# Patient Record
Sex: Female | Born: 1935 | Race: White | Hispanic: No | Smoking: Never smoker
Health system: Southern US, Community
[De-identification: ages and names within clinical notes are randomized; demographics above are authoritative.]

## PROBLEM LIST (undated history)

## (undated) DIAGNOSIS — I219 Acute myocardial infarction, unspecified: Secondary | ICD-10-CM

## (undated) DIAGNOSIS — G709 Myoneural disorder, unspecified: Secondary | ICD-10-CM

## (undated) DIAGNOSIS — F419 Anxiety disorder, unspecified: Secondary | ICD-10-CM

## (undated) DIAGNOSIS — R05 Cough: Secondary | ICD-10-CM

## (undated) DIAGNOSIS — I2510 Atherosclerotic heart disease of native coronary artery without angina pectoris: Secondary | ICD-10-CM

## (undated) DIAGNOSIS — I82409 Acute embolism and thrombosis of unspecified deep veins of unspecified lower extremity: Secondary | ICD-10-CM

## (undated) DIAGNOSIS — R053 Chronic cough: Secondary | ICD-10-CM

## (undated) DIAGNOSIS — R109 Unspecified abdominal pain: Secondary | ICD-10-CM

## (undated) DIAGNOSIS — R1312 Dysphagia, oropharyngeal phase: Secondary | ICD-10-CM

## (undated) DIAGNOSIS — I10 Essential (primary) hypertension: Secondary | ICD-10-CM

## (undated) DIAGNOSIS — M349 Systemic sclerosis, unspecified: Secondary | ICD-10-CM

## (undated) HISTORY — DX: Atherosclerotic heart disease of native coronary artery without angina pectoris: I25.10

## (undated) HISTORY — PX: CHOLECYSTECTOMY: SHX55

## (undated) HISTORY — PX: ABDOMINAL HYSTERECTOMY: SHX81

## (undated) HISTORY — PX: CARDIAC SURGERY: SHX584

## (undated) HISTORY — PX: CORONARY ARTERY BYPASS GRAFT: SHX141

## (undated) HISTORY — PX: ANKLE SURGERY: SHX546

## (undated) HISTORY — DX: Acute myocardial infarction, unspecified: I21.9

## (undated) HISTORY — PX: OTHER SURGICAL HISTORY: SHX169

## (undated) HISTORY — DX: Acute embolism and thrombosis of unspecified deep veins of unspecified lower extremity: I82.409

---

## 2000-04-09 ENCOUNTER — Inpatient Hospital Stay (HOSPITAL_COMMUNITY): Admission: AD | Admit: 2000-04-09 | Discharge: 2000-04-21 | Payer: Self-pay | Admitting: Cardiology

## 2000-04-10 ENCOUNTER — Encounter: Payer: Self-pay | Admitting: Cardiothoracic Surgery

## 2000-04-11 ENCOUNTER — Encounter: Payer: Self-pay | Admitting: Cardiothoracic Surgery

## 2000-04-12 ENCOUNTER — Encounter: Payer: Self-pay | Admitting: Cardiothoracic Surgery

## 2000-04-13 ENCOUNTER — Encounter: Payer: Self-pay | Admitting: Cardiothoracic Surgery

## 2000-04-14 ENCOUNTER — Encounter: Payer: Self-pay | Admitting: Cardiothoracic Surgery

## 2000-04-16 ENCOUNTER — Encounter: Payer: Self-pay | Admitting: Cardiothoracic Surgery

## 2000-05-19 ENCOUNTER — Inpatient Hospital Stay (HOSPITAL_COMMUNITY): Admission: AD | Admit: 2000-05-19 | Discharge: 2000-05-24 | Payer: Self-pay | Admitting: Cardiothoracic Surgery

## 2000-05-19 ENCOUNTER — Encounter: Payer: Self-pay | Admitting: Cardiothoracic Surgery

## 2001-09-13 ENCOUNTER — Ambulatory Visit: Admission: RE | Admit: 2001-09-13 | Discharge: 2001-09-13 | Payer: Self-pay | Admitting: Thoracic Surgery

## 2001-10-15 ENCOUNTER — Encounter: Payer: Self-pay | Admitting: Thoracic Surgery

## 2001-10-17 ENCOUNTER — Inpatient Hospital Stay (HOSPITAL_COMMUNITY): Admission: RE | Admit: 2001-10-17 | Discharge: 2001-10-20 | Payer: Self-pay | Admitting: Thoracic Surgery

## 2001-10-17 ENCOUNTER — Encounter (INDEPENDENT_AMBULATORY_CARE_PROVIDER_SITE_OTHER): Payer: Self-pay | Admitting: *Deleted

## 2001-10-19 ENCOUNTER — Encounter: Payer: Self-pay | Admitting: Thoracic Surgery

## 2004-02-02 ENCOUNTER — Ambulatory Visit (HOSPITAL_COMMUNITY): Admission: RE | Admit: 2004-02-02 | Discharge: 2004-02-02 | Payer: Self-pay | Admitting: Internal Medicine

## 2005-04-07 ENCOUNTER — Ambulatory Visit: Payer: Self-pay | Admitting: Cardiology

## 2005-04-12 ENCOUNTER — Ambulatory Visit: Payer: Self-pay | Admitting: Cardiology

## 2005-04-27 ENCOUNTER — Ambulatory Visit: Payer: Self-pay | Admitting: Cardiology

## 2005-12-20 ENCOUNTER — Ambulatory Visit: Payer: Self-pay | Admitting: Internal Medicine

## 2006-08-22 ENCOUNTER — Ambulatory Visit: Payer: Self-pay | Admitting: Internal Medicine

## 2006-09-28 ENCOUNTER — Ambulatory Visit: Payer: Self-pay | Admitting: Internal Medicine

## 2010-09-05 ENCOUNTER — Emergency Department (HOSPITAL_COMMUNITY): Admission: EM | Admit: 2010-09-05 | Discharge: 2010-09-05 | Payer: Self-pay | Admitting: Emergency Medicine

## 2010-09-05 ENCOUNTER — Ambulatory Visit: Payer: Self-pay | Admitting: Internal Medicine

## 2011-02-09 LAB — WOUND CULTURE

## 2011-04-14 NOTE — Discharge Summary (Signed)
[place]. [facility]  Patient:    Colleen Perry, Colleen Perry                    MRN: [id number] Adm. Date:  [id number] Disc. Date: 05/24/00 Attending:  Mikey Bussing Dictator:   Loura Pardon, P.A.                           Discharge Summary  DATE OF BIRTH:  May 16, 1932  CARDIOLOGIST:  Lewayne Bunting, M.D.  PRIMARY CAREGIVER:  Wende Crease M.D.  FINAL DIAGNOSIS:  Staphylococcus aureus saphenous venectomy wound infection right lower extremity (IV antibiotic therapy this hospitalization).  SECONDARY DIAGNOSES: 1. History of unstable angina.  Atherosclerotic coronary artery disease,    status post coronary artery bypass graft surgery by Dr. Kathlee Nations Trigt on    Apr 11, 2000. 2. Status post cholecystectomy. 3. History of ankle fracture x 2. 4. Status post carpal tunnel release. 5. History of scleroderma with esophageal stricture, periodic esophageal    dilatations. 6. Hypertension. 7. Obesity. 8. Status post hysterectomy/removal of facial tumors.  PROCEDURES:  IV antibiotics this hospitalization consisting of IV vancomycin for a five-day period with debridement of the necrotic areas of the wound in the right lower extremity as needed during this hospitalization.  DISPOSITION:  Colleen Perry is judged a suitable candidate for discharge on hospital day #6 after being admitted on May 19, 2000 for IV antibiotic treatment of Staphylococcus aureus saphenous venectomy wound infection right lower extremity.  She has remained afebrile while in this hospital.  She is ambulating independently.  She has received daily whirlpool treatments with twice daily dressing changes consisting of wet-to-dry dressing changes at the wound site of the right lower extremity.  She has had debridement of the wound of necrotic tissue at the wound base as needed.  She has evidence of granulation tissue formation.  Her pain is well-controlled with oral analgesia.  She will go home with  home health to help with daily dressing changes.  Her IV vancomycin was accompanied by IV Cipro.  This was changed on hospital day #4 to oral Cipro and she will go home on a 10-day course of oral Cipro, which is renewable upon the discretion of Dr. Kathlee Nations Trigt.  She is taking oral nourishment well and tolerating it.  She has full GI tract function.  The other incisions that were occasioned during the cardiac surgery in May 2001 are healing well.  DISCHARGE MEDICATIONS:  1. Vicodin 5/500 1-2 tablets p.o. q.4-6h. p.r.n. pain.  2. Cipro 500 mg 1 p.o. b.i.d.  3. Flovent 44 mcg per inhalation 2 puffs b.i.d.  4. Combivent metered dose inhaler 2 puffs every six hours.  5. Lasix 40 mg q.d.  6. Potassium chloride 20 mEq daily.  7. Lopressor 50 mg 1/2 tablet in the morning and 1/2 tablet in the evening.  8. Xanax 0.25 mg every eight hours as needed for anxiety.  9. Multivitamin with iron daily. 10. Enteric-coated aspirin 325 mg q.d. 11. Protonix 40 mg daily.  ACTIVITY:  Ambulation as tolerated.  DIET:  Low sodium/low cholesterol diet.  WOUND CARE:  She may bathe daily.  She is to try to time her bathing for just before dressing changes.  The bathing does not consist of bathtub soaking but only shower.  Home health will provide dressing changes once daily, wet-to-dry, with sterile saline gauze packed gently, and the wound wrapped with  Curlex.  FOLLOW-UP:  She will see Dr. Donata Clay Friday, June 01, 2000, at 10:45 in the morning for a wound check.  BRIEF HISTORY:  Colleen Perry is a 77 year old female.  She underwent coronary artery bypass graft surgery Apr 11, 2000.  She has done well postoperatively, until approximately two weeks before this admission, when she was admitted to have some erythema in the lower section of the saphenous venectomy site of the right lower extremity.  The first incision at the level of the ankle was noted to have erythema.  This was subsequently  opened slightly and the culture was taken.  She was started at that time on Augmentin; however, the culture came back positive for Staphylococcus aureus. The patient was again seen by Dr. Kathlee Nations Trigt May 18, 2000.  At that time, the wound was further opened and debrided.  The patient had been having her wound packed by home health on a daily basis but with further examination and deterioration in the status of the wound, it was decided that hospitalization would be best, and she was subsequently admitted for IV antibiotic therapy.  This was begun May 19, 2000.  She has received IV vancomycin during her hospital stay.  She has also had supplementation with IV Cipro, which was changed on hospital day #4 to p.o. Cipro.  HOSPITAL COURSE:  She was admitted to [facility] with a diagnosis of Staphylococcus aureus saphenous venectomy wound infection right lower extremity.  She was immediately placed on IV vancomycin and IV Cipro. Dressing changes were begun twice daily with one of the dressing changes to consist of a pulsatile lavage delivered by physical therapy, then with a wet-to-dry dressing placed in the wound.  This regimen has produced significant clearing of necrosis and the beginning of healing in her right lower extremity saphenous venectomy incision.  Colleen Perry has been ambulating with the help of a walker and then independently.  She goes home on the medications and with home health to follow her.  As dictated above, she will see Dr. Donata Clay on Friday, June 01, 2000, at 10:45 in the morning. DD:  05/23/00 TD:  05/24/00 Job: [id number]

## 2011-04-14 NOTE — Op Note (Signed)
Colleen Perry, Colleen Perry                       ACCOUNT NO.:  [id number]   MEDICAL RECORD NO.:  [id number]                   PATIENT TYPE:  AMB   LOCATION:  DAY                                  FACILITY:  [facility]   PHYSICIAN:  Lionel December, M.D.                 DATE OF BIRTH:  1932-03-06   DATE OF PROCEDURE:  DATE OF DISCHARGE:  02/02/2004                                 OPERATIVE REPORT   PROCEDURE:  Esophageal manometry.   INDICATIONS:  [name] is a 77 year old Caucasian female with a history of  scleroderma who presents with recurrent dysphagia.  She had a barium study  which shows narrowing to the proximal segment of esophagus with ballooning  of hypopharynx on swallowing, as well as some narrowing at the GE junction.  She is undergoing this study prior to EGD/ED.  The procedure and risks were  reviewed with the patient and informed consent was obtained.   This is the second dictation, the first one apparently got messed up and had  multiple blanks.   METHOD/PROCEDURE:  The esophageal manometry study was performed using  [employer] Gastrosoft Upper Gastrointestinal Polygram, version 6.40,  using an Arndorfer infusion system at 0.5 cc/min. The lower esophageal  sphincter (LES) was identified in four ports with the standard station pull  through technique. For esophageal body pressures, the tip of the catheter  was placed 3 cm above the proximal aspect of the LES. Ten wet swallows were  taken with ports 5 cm apart in the esophageal body. This procedure was  reviewed with the patient prior to performing it.   FINDINGS:  Esophageal body:  1 out of 10 swallows peristaltic; 9 out of 10  were absent or low amplitude.   Mean amplitude at 13 cm 84.5, at 8 cm 49.1, at 3 cm 18.9, and mean and  distal 2 ports was 34 mmHg.   Mean duration:  3.3 at 13 cm, 4.9 at 8 cm, and 2.8 at 3 cm, mean and distal  2 ports were 3.8.   Lower esophageal sphincter (LES):  Located between 42 and  45 cm from the  nares.   Mean resting LES pressure 11.3.   Relaxation:  About 80%.  There were prolonged episodes of relaxation with  some swallows.   IMPRESSION:  Abnormal study with the following findings:  1. Low LES pressure with episodes of prolonged relaxation.  2. Low-to-absent esophageal peristalsis.  3. These findings can be seen with scleroderma.  4. There is no manometric evidence of achalasia.   PLAN:  We will proceed with EGD.      ___________________________________________                                            Lionel December, M.D.   NR/MEDQ  D:  02/22/2004  T:  02/22/2004  Job:  [id number]   cc:   Fayrene Fearing M.D. Aundra Millet  [place]

## 2011-04-14 NOTE — Procedures (Signed)
[place]. [facility]  Patient:    Colleen Perry, Colleen Perry                    MRN: [id number] Proc. Date: 04/09/00 Adm. Date:  [id number] Attending:  Talitha Givens CC:         Dr. Wende Crease                           Procedure Report  PROCEDURE PERFORMED:  Left heart catheterization with coronary angiography and left ventriculography.  I. INTRODUCTION:  The patient is a 77 year old woman with a history of known coronary artery disease, who presents to the hospital with unstable angina. She was subsequently transferred here for additional evaluation and treatment. Her symptoms had been improved with intravenous heparin and nitroglycerin.  II. PROCEDURE:  After informed consent was obtained, the patient was taken to the diagnostic catheterization laboratory in the fasting state.  After usual preparation and draping, intravenous fentanyl was given for intravenous sedation.  A total of 20 cc of lidocaine was infiltrated into the right femoral region.  The right femoral artery was subsequently punctured and the left Judkins catheter was advanced into the left main coronary artery. Coronary angiography of the left main system was then carried out.  After removal of the left Judkins catheter, the right Judkins catheter was inserted and advanced into the right coronary artery and selective coronary angiography of the right coronary system was then carried out.  Following this, the right Judkins catheter was removed and the pigtail catheter was inserted retrograde across the aortic valve and into the left ventricle.  Left ventriculography in the RAO projection was then carried out.  Following this, the catheter was removed.  Hemostasis was assured and the patient was returned to her room in good condition.  III. COMPLICATIONS:  None.  IV. RESULTS:  A. HEMODYNAMICS:  The left ventricular pressure was 159/12 and the aortic pressure was 160/80.  B. LEFT  VENTRICULOGRAPHY:  Left ventriculography was performed in the RAO projection with a total of 30 cc of contrast injected over 2.5 seconds.  This demonstrated preserved left ventricular systolic function.  Interpretation of the actual left ventricular ejection fraction was not possible secondary to multiple PVCs.  There did not appear to be any segmental wall motion abnormalities.  C. CORONARY ANGIOGRAPHY:  The left main coronary artery and its proximal left circumflex and left anterior descending arteries were calcified fairly moderately.  The distal left main had a 50% stenosis.  It gave rise to a left anterior descending artery, which had a 90% ostial stenosis and was diffusely disease.  The LAD gave rise to two diagonal branches, both of which had severe disease.  The left circumflex also came off of the left main system and it had a large obtuse marginal branch with luminal irregularities.  The proximal left circumflex had a 75-80% stenosis.  The distal left circumflex was occluded and its terminal marginal branch appeared to be fed with collaterals from the right system.  The right coronary artery was a dominant vessel and had a 65-70% eccentric stenosis of the proximal portion.  In addition, there was modest diffuse disease of the distal RCA and PDA systems.  There appeared to be right to left collaterals feeding the distal LAD and the terminal obtuse marginal branch of the left circumflex system.  V. CONCLUSION:  This study demonstrates preserved left ventricular systolic function with severe coronary  disease, including a distal 50% left main stenosis, 90% ostial LAD stenosis with diffuse disease, an 80% proximal left circumflex stenosis with the distal left circumflex being occluded after a large obtuse marginal branch and finally, a 70% proximal RCA stenosis.  VI. RECOMMENDATIONS:  Will be to consult cardiovascular surgeons. DD:  04/09/00 TD:  04/10/00 Job: [id number]

## 2011-04-14 NOTE — H&P (Signed)
[place]. [facility]  Patient:    Colleen Perry, Colleen Perry Visit Number: [id number] MRN: [id number]          Service Type: Attending:  D. Karle Plumber, M.D. Dictated by:   Durenda Age, P.A.-C. Adm. Date:  09/17/01   CC:         Dr. Latina Craver, M.D.                         History and Physical  DATE OF BIRTH:  October 27, 1932  CHIEF COMPLAINT:  Substernal thyroid goiter.  HISTORY OF PRESENT ILLNESS:  Colleen Perry is a pleasant 77 year old white female referred by Dr. Doyne Keel for evaluation of substernal thyroid goiter.  The patient was experiencing dysphagia, which prompted her to be evaluated by Dr. Karilyn Cota.  Among several tests, a barium swallow was performed, revealing some neck compression in the area.  A followup CT revealed a superior mediastinal mass consistent with a thyroid goiter with marked enlargement of the left thyroid lobe in substernal position, causing deviation of the trachea and compression of the trachea to the right side, the mass measuring about 3.7 x 4.3 cm.  This mass is also compressing the esophagus and responsible for this dysphagia.  PFTs showed an FVC of 1.89 and an FEV1 of 1.32.  Dr. Edwyna Shell recommends to proceed with left thyroidectomy scheduled for September 17, 2001. Other than the symptoms mentioned above, the patient is experiencing cough with no sputum production, chills, shortness of breath and dyspnea on exertion, as well as GERD symptoms.  PAST MEDICAL HISTORY:  Hypertension, hypercholesterolemia, anxiety, GERD--esophageal stricture with periodic dilatations, history of morphea (form of scleroderma), chronic pain syndrome, history of bronchial asthma, CAD status post CABG, status post MI in May 2001, left thyroid goiter, prolapsed rectum secondary to scleroderma, decreased hearing.  PAST SURGICAL HISTORY:  Status post cholecystectomy in 1998, status post CABG x 4 Dr. Donata Clay May 2001, status post  revision and debridement of the legs secondary to cellulitis post CABG in June 2001, status post gastrostomy, status post hysterectomy, status post left carpal tunnel syndrome, status post left foot fracture repair, status post removal of a facial skin tumor.  MEDICATIONS:  1. Metoprolol 25 mg b.i.d.  2. Lasix 40 mg q.d.  3. Hydrocodone 5/500 two p.o. q.4-6h. p.r.n. for pain.  4. Zocor 20 mg q.d.  5. K-Dur unknown dose, although the patient says that it is possible 20 mEq     q.d.  6. Coated aspirin q.d.  7. Oxazepam 10 mg 1 p.o. q.h.s. p.r.n.  8. Protonix 40 mg q.d.  9. Alprazolam 0.5 mg q.i.d. p.r.n. 10. Dulcolax 2 p.o. q.h.s. 11. Albuterol inhaler 2 puffs p.r.n. q.4h. 12. Flovent 2 puffs b.i.d.  ALLERGIES:  No known drug allergies.  REVIEW OF SYSTEMS:  See HPI and past medical history for significant positives.  She also experiences hoarseness and nausea at times.  No angina or arrhythmias.  No symptoms of TIA, CVA, or amaurosis fugax.  No recent history of pneumonia.  No PE or DVT.  She has mentioned the difficulty for wound healing, as well as increased sensitivity to pain.  FAMILY HISTORY:  Mother died of CVA at 61.  Father died of MI at 32.  Two brothers died of cancer, one of brain cancer, and one of liver cancer.  SOCIAL HISTORY:  Widowed, three children.  One of  them is a Engineer, civil (consulting).  She is a housewife.  She denies any tobacco or alcohol intake.  PHYSICAL EXAMINATION:  GENERAL:  An obese 77 year old white female in no acute distress.  Alert and oriented x 3, somewhat anxious.  VITAL SIGNS:  Blood pressure 180/80, pulse 64, respirations 18.  HEENT:  Normocephalic, atraumatic.  PERRL.  EOMI.  Funduscopic exam within normal limits.  NECK:  Supple.  No JVD, bruits, or lymphadenopathy.  The thyroid cannot be palpated.  CHEST:  Symmetrical on inspiration.  Lungs clear to auscultation.  CARDIOVASCULAR:  Regular rate and rhythm with a well-healed sternotomy site. There  is an area of the sternotomy which is still tender.  No murmurs, rubs, or gallops.  ABDOMEN:  Obese, soft, nontender.  Bowel sounds x 4.  No masses or bruits.  GENITOURINARY/RECTAL:  Deferred.  EXTREMITIES:  No clubbing, positive for onychomycosis.  No cyanosis.  There is chronic left ankle edema.  This ankle is also much larger than the right, but this is chronic.  No ulcerations.  Somewhat decreased temperature in her hands bilaterally.  Peripheral pulses:  Carotids through distal pulses 2+ bilaterally.  There are rheumatoid arthritis changes in both hands with ulnar deviation, as well as scleroderma changes in the right index finger.  Of note, the patient has fairly good skin turgor, despite her history of scleroderma. NEUROLOGIC:  Other than head bobbing with mild resting tremor, no other findings are seen.  DTRs 3+ on the left, 2+ on the right.  Muscle strength 4/5 bilaterally.  ASSESSMENT AND PLAN:  The patient has a substernal thyroid goiter and will undergo substernal thyroidectomy at [facility] on September 17, 2001 by Dr. Edwyna Shell.  Dr. Edwyna Shell has seen and evaluated this patient prior to the admission and has explained the risks and benefits involved in the procedure and the patient has agreed to continue. Dictated by:   Durenda Age, P.A.-C. Attending:  D. Karle Plumber, M.D. DD:  09/13/01 TD:  09/13/01 Job: [id number]

## 2011-04-14 NOTE — Consult Note (Signed)
[place]. [facility]  Patient:    Colleen Perry, Colleen Perry                    MRN: [id number] Proc. Date: 04/10/00 Adm. Date:  [id number] Attending:  Talitha Givens Dictator:   [id number] CC:         CVTS office             Rudene Christians. Ladona Ridgel, M.D. [facility]                          Consultation Report  REASON FOR CARDIOTHORACIC SURGICAL CONSULTATION:  Severe three-vessel coronary disease with unstable angina and mildly positive cardiac enzymes.  HISTORY OF PRESENT ILLNESS:  The patient is an obese 77 year old white female with a history of coronary disease status post cardiac catheterization three years ago, who presented to her local physician with 48 hours of progressive substernal chest pain with radiation to the left arm and associated shortness of breath.  She has had increasing exercise intolerance over the past two to three weeks with dyspnea on exertion.  She has also had some difficulty with orthopnea.  She presented to her local hospital and was admitted for acute unstable angina and cardiac enzymes were mildly positive at 5 units.  She was placed on anticoagulation, beta-blocker and nitroglycerin and the cardiology consultation was obtained.  The patient was subsequently transferred to [facility] and on 04/10/00, underwent cardiac catheterization by Dr. Ladona Ridgel, which demonstrated severe three vessel coronary disease with 90% stenosis of the LAD, 80% stenosis of the proximal circumflex and 80% stenosis of the right coronary artery with overall ejection fraction of 55%  Her LVEDP was 12 mmHg.  She developed some post-cath chest pain and was stabilized with IV heparin and V nitroglycerin and is currently comfortably in her hospital room following cardiac catheterization on nitroglycerin and IV heparin.  The patient was referred for coronary revascularization due to her bad coronary  anatomy and symptoms of unstable angina.  PAST MEDICAL  HISTORY: 1. Reactive airway disease, on inhaler therapy. 2. History of scleroderma, with associated esophageal stricture for which she    received periodic dilatation by her local physician. 3. Hypertension.  SURGICAL HISTORY:  Positive for a laparoscopic cholecystectomy in 1998, orthopedic surgery on a fractured left ankle, hysterectomy and removal of facial skin tumors.  MEDICATIONS:  At home - nitroglycerin p.r.n., Cardizem XL 240 mg q.d., albuterol inhaler two puffs q.i.d., Flovent inhaler two puffs b.i.d., Lasix 40 mg p.o. q.d., aspirin one p.o. q.d.  ALLERGIES:  She denies allergies to any known medications.   SOCIAL HISTORY:  The patient lives alone; her husband died of a stroke three years ago.  Daughter lives close by, in [place].  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  She denies alcohol intake and denies smoking at any time.  REVIEW OF SYSTEMS:  The patients current weight of 200 pounds is stable.  She denies any recent fever or respiratory infection.  She denies any recent change in her bowel or bladder habits.  She does have chronic constipation and has a rectal prolapse and requires Dulcolax on a daily basis.  She has had some mild symptoms of dysphagia and was scheduled to see her local physician for an esophageal dilatation within the next 30-60 days.  Cardiac history is positive for angina and some symptoms of dyspnea on exertion and orthopnea.  She  denies any history of arrhythmia or cardiac murmur.  Pulmonary history is positive for reactive airway disease but no recent productive cough or upper respiratory infection.  GI history is positive for a cholecystectomy and she denies any blood per rectum, jaundice or abdominal pain.  GU history is negative for polyuria or dysuria.  Vascular history is negative for DVT or claudication.  Neurologic history is negative for TIA, seizure, syncope or concussion.  Hematologic history is  negative for bleeding diathesis or easy bruisability.  Skin has developed some small tumors, which have been excised.  Psychologic history is negative for depression, insomnia or diminished appetite.  PHYSICAL EXAMINATION:  GENERAL:  She is a pleasant, anxious female who appears older than her stated age. Height 5 ft 4 in.  Weight 200 lb.  VITAL SIGNS:  Blood pressure 150/80, pulse 70 per minute in sinus rhythm and she is comfortable, at rest.  HEENT:  Exam normocephalic, with full EOMS.  Pharynx clear.  Dentition is under  good repair.  NECK:  Supple, without JVD, thyromegaly, mass or carotid bruit.  LUNGS:  Clear, without wheeze and without chest wall deformity.  CARDIAC:  Regular rate and rhythm without S3 gallop or cardiac murmur.  ABDOMEN:  Soft, with well-healed incisions from laparoscopic cholecystectomy and bowel sounds are present.  She has a slight ecchymotic area on the right groin secondary to the cardiac cath.  EXTREMITIES:  Her left ankle has changes of orthopedic surgery and the left lower leg is mildly edematous, without pitting edema.  She has 1+ palpable pedal pulses bilaterally.  She has a palpable radial artery pulses bilaterally.  She is right hand dominant.  SKIN:  Without evidence of skin cancer or rash.  NEUROLOGIC:  Alert and oriented x 3 with full motor function.  LABORATORY DATA:  Chest x-ray is pending.  Vascular lab studies indicate mild plaque and atherosclerotic disease of the carotid arteries bilaterally.  Has left palmar arch insufficiency; the right palmar arch is normal.  Peripheral pulses are intact in the lower extremities.  Preoperative electrolytes: normal BUN and creatinine and blood glucose.  IMPRESSION/PLAN:  The patient has severe three vessel coronary disease with symptoms of unstable angina and will be scheduled for coronary bypass surgery in the morning.  I discussed the major aspects of the operation with the  patient and daughter, including the location of the surgical incisions, the choice of conduit,  the use  of cardiopulmonary bypass and general anesthesia, and the expected hospital recovery.  We discussed the associated risks of myocardial infarction, cerebro-  vascular accident, bleeding, infection and death.  We discussed the alternatives to surgery for her coronary artery disease.  She understands these aspects and agrees to proceed with the operation as planned, under informed consent. DD:  04/10/00 TD:  04/10/00 Job: [id number]

## 2011-04-14 NOTE — Op Note (Signed)
NAMEBAHJA, BENCE                       ACCOUNT NO.:  [id number]   MEDICAL RECORD NO.:  [id number]                   PATIENT TYPE:  AMB   LOCATION:  DAY                                  FACILITY:  [facility]   PHYSICIAN:  Lionel December, M.D.                 DATE OF BIRTH:  08/04/1932   DATE OF PROCEDURE:  02/02/2004  DATE OF DISCHARGE:                                 OPERATIVE REPORT   PROCEDURE:  Esophagogastroduodenoscopy with esophageal dilatation.   ENDOSCOPIST:  Lionel December, M.D.   INDICATIONS:  Colleen Perry is a 77 year old Caucasian female who presents with  progressive dysphagia to solids, liquids, as well as pills.  She has had her  esophagus dilated previously.  In the past she is felt to have esophageal  motility disorder possibly on the basis of her scleroderma. She had a barium  study on January 29, 2004 which showed partial obstruction or narrowing at the  cervical esophagus with ballooning of hypopharynx.  She aspirated some  barium.  She had somewhat dilated body of the esophagus with narrowing at GE  junction.  Barium pill passed across the cervical esophagus without any  difficulty, but there was a hang up at the gastroesophageal junction.  She  is, therefore undergoing reevaluation with EGD.  She had esophageal  manometry this morning which shows complete relaxation of LES but she has  simultaneous swallows.  Therefore, EM is consistent with __________ study  reveals either poor peristalsis or simultaneous contractions.  Manometrically she does not meet the criterion for achalasia   She also has a history of submucosal bulbar lesion since May 2000.   She is undergoing diagnostic and therapeutic procedure.  The procedure and  risks were reviewed with the patient and informed consent was obtained.   PREOPERATIVE MEDICATIONS:  Cetacaine spray for oropharyngeal topical  anesthesia, Demerol 50 mg IV and Versed 15 mg IV.   FINDINGS:  Procedure performed in endoscopy  suite.  The patient's vital  signs and O2 saturation were monitored during the procedure and remained  stable.  The patient was placed in the left lateral recumbent position and  Olympus videoscope was passed via the oropharynx without any difficulty into  the esophagus.  Her hypopharyngeal mucosa and laryngeal area appeared to be  normal. Pictures taken for the record.   There was some resistance noted with passage of the scope across the  proximal esophagus, but examination of the mucosa on the way out revealed no  abnormality.  Some resistance was noted as the scope was passed across the  ____________ into the cervical esophagus.  There was a tiny patch of ectopic  gastric mucosa.  The mucosa of the rest of the esophagus was normal. The  squamocolumnar junction was wavy, but there was no ring or stricture.  There  was a small sliding hiatal hernia no more than 3 cm in length.   STOMACH:  It  was empty and distended very well with insufflation.  The folds  of the proximal stomach were normal.  Examination of the mucosa was normal.  The pyloric channel was patent.  The angularis, fundus, and cardia were  examined by retroflexing the scope and were normal.   DUODENUM:  On entering the bulb there was at least a 15 cm mass involving  the posterolateral wall.  Endoscopically it did not appear to be a bigger  than on previous study of January 2002; however, it has central depression  with erosion.  There was no bleeding.  I opted not to take any biopsy from  it.  The mucosa and folds of the second part of the duodenum were normal.   The endoscope was withdrawn.   The esophagus was dilated by passing 54 and 56 [place] Maloney dilator to  full insertion.  Some resistance was noted in the early stage felt to be due  to a cervical esophagus.  The endoscope was passed again, and esophagus  reexamined and there was a very tiny linear tear in the area of the cervical  esophagus.  There was no  mucosal disruption at the GE junction.   The endoscope was withdrawn.  The patient tolerated the procedure well.   FINAL DIAGNOSES:  1. Small sliding hiatal hernia.  No endoscopic evidence of stricture at GE     junction or proximal esophagus.  Some resistance noted on passage of the     scope felt to be due to her cervical esophagus.  Suspect a muscular     process or spasm of the segment.  2. Esophagus dilated by passing 54 and 56 [place] Maloney dilator.  3. Submucosal bulbar lesion which is unchanged; however, has central     depression with erosion.   RECOMMENDATIONS:  1. She will continue her usual medications.  She should also continue with     her PPI.  2. The patient advised to watch her stools and if they turn black she needs     to let Dr. Doyne Keel or myself know.  3. I feel that she will need myotomy of this proximal esophageal segment via     neck approach. Will discuss findings and recommendations with the her     daughter, Ms. [name], when she is here.      ___________________________________________                                            Lionel December, M.D.   NR/MEDQ  D:  02/02/2004  T:  02/02/2004  Job:  [id number]   cc:   Wende Crease, M.D.

## 2011-04-14 NOTE — Discharge Summary (Signed)
[place]. [facility]  Patient:    Colleen Perry, Colleen Perry                    MRN: [id number] Adm. Date:  [id number] Disc. Date: 04/21/00 Attending:  Mikey Bussing Dictator:   Sherrie George, P.A. CC:         Kathlee Nations Suann Larry, M.D.             Lewayne Bunting, M.D. - [facility]             Norval Morton, M.D. - [place], [facility]                           Discharge Summary  DATE OF BIRTH:  03/03/1932.  ADMITTING DIAGNOSES: 1. Unstable angina. 2. History of scleroderma with associated esophageal stricture and periodic    esophageal dilatation. 3. Hypertension.  DISCHARGE DIAGNOSES: 1. Three vessel coronary disease with unstable angina. 2. History of scleroderma with a history of esophageal strictures. 3. Hypertension. 4. Dysphagia and postoperative nausea. 5. Perioperative hyperglycemia. 6. Bilateral atelectasis. 7. Obesity.  PROCEDURES: 1. Cardiac catheterization Apr 09, 2000. 2. Coronary artery bypass grafting x 4, left internal mammary to the left anterior descending, saphenous vein graft to the circumflex, saphenous vein graft posterior descending and posterior lateral Apr 11, 2000.  BRIEF HISTORY:  The patient is a 77 year old white female, a medical patient of Dr. Lewayne Bunting who was transferred from [facility] with chest pain.  She had a 48 hour history of progressive substernal pain with radiation to the left arm and associated shortness of breath.  She had increasing exercise intolerance over the past 2-3 weeks with dyspnea on exertion and some difficulty with orthopnea.  She presented to her local hospital and was admitted for acute unstable angina.  Cardiac enzymes were mildly elevated. She was placed on anticoagulation, beta blockers, and nitroglycerin and a cardiology consult was obtained.  She was seen and subsequently transferred to [facility] on Apr 09, 2000, for cardiac catheterization by  Dr. Ladona Ridgel.  PAST MEDICAL HISTORY:  includes reactive airway disease on inhaler therapy.  A history of scleroderma with associated esophageal strictures for which she has received periodic dilatations and hypertension.  PAST SURGICAL HISTORY:  Include laparoscopic cholecystectomy, orthopedic surgery on a fracture left ankle, hysterectomy and removal of facial tumors.  MEDICATIoNS AT HOME: 1. Cardizem XL 240 mg q.d. 2. Nitroglycerin p.r.n. 3. Albuterol inhaler 2 puffs q.i.d. 4. Flovent 2 puffs b.i.d. 5. Lasix 40 mg q.d. 6. Aspirin 1 q.d.  For further history and physical please see the dictated note.  HOSPITAL COURSE:  The patient was admitted and underwent cardiac catheterization which showed a 50% distal left main, 90% ostial LAD with diffuse LAD disease and an 80% proximal left circumflex with total occlusion of the distal circumflex, a 70% proximal right coronary artery eccentric lesion was also present.  Ejection fraction was estimated at 55%.  The patient tolerated the procedure well and after completion consult with Dr. Kathlee Nations Trigt was obtained for revascularization of her coronary artery disease.  Dr. Donata Clay evaluated the patient and he agreed she had severe 3 vessel disease with symptoms of unstable angina and scheduled her for coronary artery bypass grafting.  The risks and benefits were discussed in detail and informed consent was obtained.  The patient remained hemodynamically stable and had no perioperative problems.  She  underwent routine preoperative studies and on Apr 11, 2000, underwent coronary artery bypass grafting x 4, with left internal mammary to the left anterior descending, saphenous vein graft to the circumflex, saphenous vein graft sequentially to the posterior descending and posterolateral coronary arteries.  The patient tolerated the procedure well and returned to the intensive care unit in satisfactory condition.  She had poor quality vein  and diffuse coronary artery disease.  The patient also had a hematocrit of 20 while on cardiopulmonary bypass and was transfused with 1 unit of packed cells.  She was then transferred to the ICU in satisfactory condition.  She remained hemodynamically and neurologically intact.  She was extubated on the postoperative day.  On the first postoperative morning she was in sinus rhythm, hemodynamically stable on low dose dopamine. O2 saturations were 99% on 2 liters.  Chest x-ray showed some left lower lobe atelectasis.  CBGs were within normal limits and overall she was doing well. Initially they weaned her of dopamine, discontinued her lines, and will plan to keep her in the [facility] secondary to her debilitated state.  She continued to do well with saturations up to 93% on room air by the first postoperative evening.  The second postoperative day chest x-ray showed bilateral atelectasis and mild edema but overall she was intact and hemodynamically stable.  They continued diuresis and she was kept in the ICU until Apr 13, 2000 when a bed was available on 2000.  She was transferred.  She had difficulty swallowing and was placed on a dysphagia 2 diet.  She was seen in consultation by the speech therapy and she was maintained on a dysphagia 2 diet and slowly advanced to a dysphagia 3 diet.  She has had postoperative nausea along with some wheezing and rales and has been treated with inhalers. She has been diuresed vigorously, has been slow to ambulate, she has required a walker and a fair amount of assistance. Despite this she has continued to show improvement.  She is now weaned off her oxygen with saturations greater than 90 on room air.  Her weight is still elevated.  She has plus 1 to plus 2 edema especially in her right lower extremity.  Her nausea has been attributed to Percocet and she has been switched to Darvocet for pain.  She has had moderate success with this.  As she has continued to  improve it was Dr. Vincent Gros opinion on Apr 16, 2000, that she would be ready for discharge home in  the a.m. Apr 21, 2000, with a home health nurse.  He wanted to make sure the patient was on Lasix 40 mg a day for 30 days, and Darvocet for pain.  Additional medications will include: 1. Protonix 40 mg 1 q.d. for 30 days. 2. Lasix 40 mg p.o. q.d. for 30 days. 3. POtassium chloride 20 mEq q.d. 4. Flovent 44 mcg 2 puffs b.i.d. 5. Combivent MDI 2 puffs q.i.d. 6. Coated aspirin 325 mg 1 q.d. 7. A multivitamin with iron 1 q.d. 8. Lopressor 25 mg q.12h. 9. Darvocet as noted above.  DISCHARGE ACTIVITY:  Light to moderate.  No lifting over 10 pounds.  No driving, no strenuous activity.  FOLLOWUP:  The patient will return to see Dr. Donata Clay on May 11, 2000, at 9:45 a.m. with a chest x-ray from Dr. Felisa Bonier office.  She will return to see Dr. Andee Lineman on May 04, 2000, at 10 a.m.  LABORATORY DATA:  Sodium 138, potassium 4.3, chloride 100,  CO2 33, glucose is 97, BUN 14, creatinine 0.8, calcium 8.4, hemoglobin is 9.0, hematocrit 26.6, platelets are 360,000, white count is 7.8.  CONDITION ON DISCHARGE:  Improving. DD:  04/20/00 TD:  04/20/00 Job: [id number]

## 2011-04-14 NOTE — Op Note (Signed)
[place]. [facility]  Patient:    Colleen Perry, Colleen Perry                    MRN: [id number] Proc. Date: 04/11/00 Adm. Date:  [id number] Attending:  Mikey Bussing CC:         CVTS Office             [facility]                           Operative Report  PROCEDURE:  Aortocoronary bypass grafting x 4 (left internal mammary artery to AD, saphenuos vein graft to obtuse marginal, sequential saphenuos vein graft to posterior descending and posterolateral branch of the right coronary).  PREOPERATIVE DIAGNOSIS:  Class IV unstable angina with severe three vessel coronary artery disease.  POSTOPERATIVE DIAGNOSIS:  Class IV unstable angina with severe three vessel coronary artery disease.  SURGEON:  Mikey Bussing, M.D.  ASSISTANT:  Eugenia Pancoast, P.A.  ANESTHESIA:  General by Janetta Hora. Gelene Mink, M.D.  INDICATIONS:  The patient is a 77 year old obese white female with new onset chest pain and admission for rule out MI.  Cardiac catheterization demonstrated severe three vessel coronary artery disease including 90% stenosis of the LAD, 90% stenosis of the OM, and 80 to 90% stenosis of the right coronary artery with distal disease as well.  She was referred for coronary revascularization.  Prior to the operation, the patient was examined in her hospital room following cardiac catheterization and the results of the cardiac catheterization were discussed with the patient and family.  The indications and expectant benefits of coronary artery bypass grafting were discussed.  I reviewed the major aspects of the procedure including the placement of the surgical incisions, the use of cardiopulmonary bypass with general anesthesia, the expected possible recovery, and the choice f conduit.  I discussed the risks associated with this operation including the risks of MI, CVA, bleeding, infection, blood transfusion requirement, and  death.  She  understood these implications for surgery and agreed to proceed with the operation as planned under informed consent.  FINDINGS:  Due to the patients body habitus, exposure of the posterior lateral aspect of the left ventricle was very difficult.  The circumflex was intramyocardial and would not be a vessel for redo grafting.  The saphenous vein was of poor quality and the entire greater saphenous vein was harvested in order to find adequate vein to use.  The left leg has been severely damaged from multiple orthopedic procedures.  The mammary artery was small, but with excellent flow, nd the coronaries were diffusely diseased and suboptimal targets for grafting.  DESCRIPTION OF PROCEDURE:  The patient was brought to the operating room and placed supine on the operating table where general anesthesia was induced under invasive hemodynamic monitoring.  The chest, abdomen, and legs were prepped with Betadine and draped as a sterile field.  A median sternotomy was performed and the saphenous vein was harvested from the right lower extremity.  The internal mammary artery was harvested as a pedicle graft from its origin at the subclavian vessels. Heparin was administered and ACT was documented as being therapeutic.  The patient was hen placed on bypass and cooled to 32 degrees.  The coronaries were identified and he mammary artery and vein grafts were prepared for the distal anastomosis.  A cardioplegia cannula was placed and the aortic crossclamp was applied, 500  cc of cold blood cardioplegia was delivered to the aortic root with immediate cardioplegic arrest and septal temperature dropping to less than 12 degrees. Topical iced saline slush was used to augment myocardial preservation and a pericardial insulator pad was used to protect the left phrenic nerve.  The distal coronary anastomoses were then performed.  The first distal anastomosis was the sequential vein  graft to the posterior descending continuing to the posterolateral.  The posterior descending was a 1.5 mm vessel with proximal 90%  stenosis and a side-to-side anastomosis with the vein was performed with a running 7-0 Prolene with good flow through the graft.  The second distal anastomosis was the continuation of the sequential vein to the posterolateral branch of the right. This was a larger 1.8 mm vessel with proximal 90% stenosis.  The end of the vein was sewn end-to-side with a running 7-0 Prolene and there was good flow through  this sequential vein graft.  Cardioplegia was redosed.  The third distal anastomosis was to the obtuse marginal.  This was intramyocardial on the lateral wall of the left ventricle.  It was 1.5 mm in diameter and a saphenous vein was  sewn end-to-side with a running 7-0 Prolene with good flow through the graft. Cardioplegia was redosed.  The fourth distal anastomosis was to the distal third of the LAD which was a 1.5 mm vessel.  It had a proximal 90% stenosis.  The left internal mammary artery pedicle was brought through an opening created in the left lateral pericardium, was brought down, and sewn end-to-side with a running 8-0 Prolene.  There was excellent flow through the anastomosis with immediate rise n septal temperature after release of the pedicle clamp on the mammary artery. The mammary pedicle was secured to the epicardium and the aortic crossclamp was removed.  The heart resumed a spontaneous rhythm.  A partial occluding clamp was placed on the ascending aorta and two proximal vein anastomoses were performed using a 4.0 mm punch and running 6-0 Prolene.  The partial clamp was removed and the vein grafts were perfused.  The patient was rewarmed to 37 degrees and temporary pacing wires were applied.  The lungs were reexpanded and the ventilator was turned on. When the patient was warmed, she was weaned from cardiopulmonary bypass  without inotropes with a stable blood pressure and excellent cardiac output.  Protamine was administered and the cannuli were removed.  The mediastinum was irrigated with arm  antibiotic irrigation and the leg incision was irrigated and closed in a standard fashion.  The pericardium was loosely reapproximated and two mediastinal and left pleural chest tubes were placed.  Hemostasis was adequate.  The sternum was reapproximated with eight interrupted steel wires.  The pectoralis fascia and subcutaneous layers were closed with a running Vicryl.  The skin was closed with a subcuticular and sterile dressings were applied.  Total cardiopulmonary bypass ime was 140 minutes with aortic crossclamp time of 80 minutes. DD:  04/11/00 TD:  04/16/00 Job: [id number]

## 2011-04-14 NOTE — Op Note (Signed)
NAMEALFRETTA, PINCH                       ACCOUNT NO.:  [id number]   MEDICAL RECORD NO.:  [id number]                   PATIENT TYPE:  AMB   LOCATION:  DAY                                  FACILITY:  [facility]   PHYSICIAN:  Lionel December, M.D.                 DATE OF BIRTH:  June 14, 1932   DATE OF PROCEDURE:  DATE OF DISCHARGE:  02/02/2004                                 OPERATIVE REPORT   PROCEDURE:  Esophageal manometry.   INDICATIONS:  Ms. Beman is a 77 year old Caucasian female with a history of  scleroderma who presented to the physician's office as well as __________  proximal esophagus.  She has ballooning of her hypopharynx on swallowing and  she had some aspiration.  She also has ___________ level of GE junction.  Barium pill passed through proximal esophagus ____________ but did get hung  up at the level of the GE junction.  She is also felt to have __________   DICTATION BECOMES INAUDIBLE AT THIS POINT.      ___________________________________________                                            Lionel December, M.D.   NR/MEDQ  D:  02/11/2004  T:  02/12/2004  Job:  [id number]

## 2011-04-14 NOTE — Op Note (Signed)
[place]. [facility]  Patient:    Colleen Perry, Colleen Perry Visit Number: [id number] MRN: [id number]          Service Type: SUR Location: [facility] 28[address] [id number] Attending Physician:  Cameron Proud Dictated by:   D. Karle Plumber, M.D. Admit Date:  10/17/2001   CC:         Dr. Doyne Keel in [facility]   Operative Report  PREOPERATIVE DIAGNOSIS:  Substernal multinodular goiter with a substernal thyroid goiter.  POSTOPERATIVE DIAGNOSIS:  Substernal multinodular goiter with a substernal thyroid goiter.  OPERATION PERFORMED:  Thyroidectomy in the left lobe of the thyroid, resection of multinodular goiter.  SURGEON:  D. Karle Plumber, M.D.  FIRST ASSISTANT:  Adair Patter, P.A.  ANESTHESIA:  General.  INDICATIONS:  This patient had had a previous coronary artery bypass and was found to have a substernal goiter that was on the left side with tracheal deviation.  This had apparently increased in size since her coronary artery bypass.  DESCRIPTION:  She was brought to the operating room, underwent general anesthesia.  The neck and chest were prepped and draped in the usual sterile manner.  An 8-10 cm incision was made in the skin fold in the neck approximately 2 cm above the sternal notch and dissection was carried down to the subcutaneous tissue.  An anterior superior and inferior flaps were elevated, elevating superiorly up to the hyoid bone and inferiorly down to the clavicles.  The strap muscles were identified and opened in the midline, and dissection was carried down to the trachea.  The sternohyoid and the sternothyroid muscles were reflected laterally.  The sternal hyoid was partially divided.  The right lobe of the thyroid was essentially normal - maybe slightly enlarged but there was not goiterness to require resection. The isthmus was dissected up and looped with the vascular tape, and then dissection was started on the left lobe of the thyroid.  Dissection  was carried superiorly, dissecting up the superior thyroid artery and veins and doubly ligating them with 2-0 silk and dividing them, then dissecting all attachments attempting to stay on the capsule of the thyroid, then looking for parathyroids.  One was thought to be identified on the left side superiorly around the superior thyroid artery.  Then dissection was carried down, getting the middle thyroid artery and vein and then double ligating them with 2-0 silk and dividing them.  Then dissection was carried inferiorly and underneath the sternum and the goiter was dissected up and elevated into the neck after dissecting it free inferiorly, and then it was elevated.  Multiple small vessels including the inferior thyroid artery and vein were doubly ligated and divided.  The isthmus was then divided with 2-0 silk.  As the left lobe of the thyroid was dissected up, the recurrent nerve was identified and preserved and all attachments to the trachea were clipped and divided until the left lobe was removed.  The area was irrigated copiously.  A Jackson-Pratt drain was placed in the thyroid bed, and then the strap muscles were closed with interrupted 2-0 Vicryl and subcutaneous tissues with 3-0 Vicryl, and a subcuticular stitch with 4-0 Monocryl.  The patient was returned to the recovery room in stable condition. Dictated by:   D. Karle Plumber, M.D.  Attending Physician:  Cameron Proud DD:  10/17/01 TD:  10/17/01 Job: [id number]

## 2011-04-14 NOTE — Discharge Summary (Signed)
[place]. [facility]  Patient:    Colleen Perry, Colleen Perry Visit Number: [id number] MRN: [id number]          Service Type: SUR Location: [id number] Attending Physician:  Cameron Proud Dictated by:   Gwenyth Bender, C.R.N.A. Admit Date:  10/17/2001 Discharge Date: 10/20/2001   CC:         Dr. Darrall Dears, M.D.   Discharge Summary  DATE OF BIRTH:  [birth year]  ADMISSION DIAGNOSIS:  Substernal thyroid goiter.  PAST MEDICAL HISTORY:  1. Hypertension.  2. Hypercholesterolemia.  3. Anxiety.  4. GERD and esophageal stricture with periodic dilations.  5. History of morphea (form of scleral derma).  6. Chronic pain syndrome.  7. Bronchial asthma.  8. Coronary artery disease, status post myocardial infarction and coronary     artery bypass grafting in May of 2001.  9. Prolapsed rectum. 10. Decreased hearing. 11. History of recent guaiac positive stools, evaluated with an endoscopy     that reveals stomach polyps.  This has since resolved.  PAST SURGICAL HISTORY:  Status post gastrostomy, hysterectomy, and repair of left foot fracture.  ALLERGIES:  No known drug allergies.  DISCHARGE DIAGNOSES: 1. Substernal multinodular goiter, status post left thyroidectomy.  BRIEF HISTORY:  Colleen Perry is a 77 year old Caucasian female who was referred to Dr. Edwyna Shell by Dr. Doyne Keel for evaluation of substernal thyroid goiter. The patient was experiencing some dysphagia.  She saw Dr. Doyne Keel who recommended barium swallow.  This swallow revealed some neck compression.  A follow-up CT scan revealed a superior mediastinal mass measuring 3.7 x 4.3 cm.  This mass is consistent with a thyroid goiter and was causing deviation and compression of the trachea, also compression of the esophagus.  She was evaluated by Dr. Edwyna Shell at his office and he recommended surgical excision of this mass.  HOSPITAL COURSE:  On November 21, Colleen Perry was electively  admitted to [facility] under the care of D. Karle Plumber, M.D.  She underwent an uncomplicated left thyroidectomy, resection of multinodular goiter. She tolerated the procedure well and was transferred in stable condition to the PACU.  Postoperatively, Colleen Perry has remained hemodynamidally stable.  Initially, she had some difficulty swallowing, but this has resolved.  She is progressing well and recovering from her surgery and she is ready for discharge home today, October 20, 2001.  CONDITION ON DISCHARGE:  Improved.  DISCHARGE INSTRUCTIONS:  Include medications, activity, diet, wound care, and follow-up appointments.  Please see the discharge instruction sheet for details.  DISCHARGE MEDICATIONS:  1. Vicodin one to two p.o. q.4-6h. p.r.n. for pain.  2. Metoprolol 12.5 mg p.o. b.i.d.  3. Furosemide 40 mg p.o. q.d.  4. Potassium 20 mEq p.o. q.d.  5. Zocor 20 mg p.o. q.h.s.  6. Aspirin 325 mg p.o. q.d.  7. Protonix 40 mg p.o. q.d.  8. Oxazepam 10 mg p.o. q.h.s.  9. Alprazolam 0.25 mg p.o. p.r.n. for anxiety. 10. Albuterol inhaler p.r.n. 11. Dulcolax suppository p.r.n.  FOLLOW-UP:  The [facility] will be calling Colleen Perry with an appointment to see Dr. Edwyna Shell in approximately one week. Dictated by:   Gwenyth Bender, C.R.N.A. Attending Physician:  Cameron Proud DD:  10/20/01 TD:  10/21/01 Job: [id number]

## 2011-04-14 NOTE — H&P (Signed)
[place]. [facility]  Patient:    Colleen Perry, Colleen Perry                    MRN: [id number] Adm. Date:  [id number] Disc. Date: [id number] Attending:  Mikey Bussing Dictator:   Eugenia Pancoast, P.A. CC:         Mikey Bussing, M.D.             Lewayne Bunting, M.D.                         History and Physical  CHIEF COMPLAINT:   Infection in right lower leg.  HISTORY OF PRESENT ILLNESS:  This is a 77 year old female status post coronary artery bypass surgery on Apr 11, 2000.  She had done well postoperatively until approximately two weeks ago when she was noted to have some erythema of the lower section of the venectomy harvest site of the right leg.  The harvest site was done in skipped lesion fashion.  The first lesion at the ankle was noted to have erythema and this was subsequently opened up slightly and culture was done.  The patient was started on Augmentin at that time.  The culture came back positive for Staphylococcus aureus.  The patient was again seen by Mikey Bussing, M.D., on May 18, 2000.  At that time, the wound was opened further.  The patient had been having her wound packed by home health on a q.d. basis, but on further examination in the hospital it was noted not to be improving and subsequently it was decided that she should undergo IV antibiotic therapy.  She is subsequently admitted at this time to undergo IV antibiotic therapy and undergo dressing changes on a b.i.d. basis.  PAST MEDICAL HISTORY:  Significant for coronary artery bypass surgery on Apr 11, 2000.  She also had a cholecystectomy in the past.  She has had a fractured left ankle x2.  She had carpal tunnel release in the past.  PRESENT MEDICATIONS:  1. Enteric-coated aspirin 325 mg q.d.  2. Protonix 40 mg q.d.  3. Flovent 40 mg two puffs b.i.d.  4. Combivent inhaler two puffs q.i.d.  5. Multivitamin with iron q.d.  6. Lortab 5 mg q.4h. p.r.n. pain.  7. Colace  100 mg b.i.d.  8. Lopressor 50 mg 1/2 tablet q.12h.  9. Lasix 40 mg q.d. 10. K-Dur 20 mEq q.d. 11. Xanax 0.25 mg t.i.d. p.r.n.  ALLERGIES:  No known drug allergies.  SOCIAL HISTORY:  The patient is a widow.  She has a grown daughter.  She is retired.  She follows a low-fat, low-sodium diet.  She denies use of alcohol or tobacco.  FAMILY HISTORY:  Positive for coronary artery disease, cancer and strokes.  No history of kidney disease, diabetes or TB.  REVIEW OF SYSTEMS:  The patient denies any recent history of weight gain, weight loss, weakness, fever, chills, sweats, rashes, dizziness, cough, chest pain, COPD, PUD, PND, anemia, syncope, melena, hemoptysis, or hematemesis.  PHYSICAL EXAMINATION:  GENERAL:  Reveals a well-developed, well-nourished 77 year old female in no acute distress.  Oriented x 3.  Judgment and insight are appropriate.  VITAL SIGNS:  The patient is afebrile.  Vital signs are stable.  HEENT:  Ramer, AT, EOMI, PERRL, TMs clear.  Oropharynx clear.  Tonsils benign. Dentition adequate.  NECK:  Supple without JVD, lymphadenopathy, or thyromegaly.  No carotid bruits are noted.  Trachea in the midline.  CHEST:  Symmetrical inspiration without wheezes, rhonchi, or rales. ___________  equal bilaterally.  Percussion resonant.  There is a mediastinotomy scar which is healing satisfactorily.  CARDIOVASCULAR:  Regular rate and rhythm without murmur, rub, or gallop. PMI is not displaced.  No lifts, heaves, thrills, or rubs.  ABDOMEN:  Soft, bowel sounds heard in all four quadrants, nontender, no palpable pulsatile masses, no HSM, and no bruits are noted.  GENITOURINARY:  Deferred.  RECTAL:  Deferred.  EXTREMITIES:  Without clubbing, cyanosis, or edema.  The right lower leg at the ankle noted incision approximately 5 cm in length with surrounding erythema.  The wound is open with packing in place.  The rest of the incisions up the leg to the thigh are healing well  without any sign of any type of infection noted.  Peripheral pulses are intact.  NEUROLOGICAL:  Cranial nerves II-XII grossly intact without focal deficits. Muscle strength is equal bilaterally.  Gait is stable.  DTRs 2+ bilaterally. Spine is straight.  Sensory is intact without numbness or paresthesias.  IMPRESSION: 1. Infection, right lower extremity. 2. Coronary artery disease status post coronary artery bypass surgery. 3. Hypertension. 4. Anxiety. 5. Chronic obstructive pulmonary disease.  PLAN:  Admit for IV antibiotic therapy and wound care per Dr. Kathlee Nations Trigt. D:  05/18/00 TD:  05/18/00 Job: [id number]

## 2011-04-14 NOTE — Consult Note (Signed)
[name], [place]             ACCOUNT NO.:  [id number]   MEDICAL RECORD NO.:  [id number]           PATIENT TYPE:  AMB   LOCATION:                                FACILITY:  [facility]   PHYSICIAN:  Lionel December, M.D.    DATE OF BIRTH:  12/10/1931   DATE OF CONSULTATION:  08/22/2006  DATE OF DISCHARGE:                                   CONSULTATION   PRESENTING COMPLAINT:  Problems swallowing and lower abdominal pain.   HISTORY OF PRESENT ILLNESS:  Colleen Perry is a 77 year old Caucasian female who  is referred through courtesy of Dr. Doyne Keel for GI evaluation.  The patient  is well known to me from previous evaluation.  She was seen for dysphagia in  January this year.  Barium pill study was actually scheduled on 01/04/2006  at [facility] but the patient canceled or did not show up.   She continues to complain of dysphagia.  She has difficulty with solids as  well as liquids.  She has difficulty virtually every time with solids,  particularly meats and difficulty with liquids is intermittent.  She has  coughing spells when she eats and at times she regurgitates her food and  liquids via nose.  She also complains of inability to clear her throat of  saliva.  She wakes up intermittently and unable to do so.  She has no  appetite.  She has been losing weight which is felt to be involuntary.  She  has lost 20 pounds since her last visit in January.  She also states that  she cannot take a deep breath.  She can complains of insomnia.  She would  like to get her temazepam filled until she is able to see Dr. Eliberto Ivory, who is  the patient's primary care physician since Dr. Doyne Keel is working as a  hospitalist.  The patient also complains of abdominal pain which she has had  for 5 months.  Her daughter states that she has had this much longer.  Pain  is across the lower abdomen more on the left side than on the right,  described as sharp and may last for several minutes.  No association with  meals or  bowel movements but rest seemed to help as does her Vicodin.  She  has chronic constipation.  Her bowels move [place] as long as she stays on her  Dulcolax tablets.  She has history of rectal prolapse but lately has not had  problems.  She has noted scant amount of dark blood on few occasions.  She  lives alone and she does cooking on her own.  Her daughter helps her every  and [name], who is with her today also visits her frequently.   [name] has a long history of GERD.  She has undergone multiple EGDs in the  past.  She had one in October 1987.  Her esophagus was normal.  She had a  small polyp removed from her duodenum which was nonspecific.  She had  another EGD in May 2000 with esophageal dilation.  She had small sliding  hiatal  hernia and mild changes of reflux esophagitis limited to GE junction  and esophagus was empirically dilated.  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  She had another EGD  in January 2002 with esophageal dilation.  She had small sliding hiatal  hernia and stable submucosal lesion in the bulb felt to be leiomyoma.  She  had barium study in August 2002 for persistent dysphagia.  She had posterior  impression on the lower cervical esophagus consistent with cricopharyngeus  muscle but no ulcerations were noted.  13 mm barium pill traversed the  esophagus without any difficulty and there was question of extrinsic mass  effect in upper thoracic esophagus.  This was evaluated with chest CT  revealing superior mediastinal mass which turned out to be substernal  thyroid goiter which was further evaluated with chest CT showing a superior  mediastinal mass.  This was eventually removed at the time of CABG as  discussed below.   She another EGD in March 2005 revealing small sliding hiatal hernia but no  obvious stricture.  Duodenal submucosal mass was stable.   She had esophageal manometry in March 2005 which revealed low  LES pressure  with prolonged relaxation and low to absent esophageal peristalsis felt to  be consistent with a known diagnosis of scleroderma.   Finally she had another barium study in December 2006 by Dr. Doyne Keel which  suggested the narrowing seen in the lower cervical esophagus, pooling of  contrast in the piriform sinus and penetration to posterior to the  epiglottis.  Is unclear whether or not she has been seen by speech  pathologist.   She is presently on alprazolam 1 mg q.i.d., Zocor 20 mg q.d., Protonix 40 mg  q.a.m., MVI q.d., K-Dur 20 milliequivalents q.d., albuterol inhaler 2 puffs  q.i.d. p.r.n., torsemide 20-60 mg q.d., Celexa 40 mg q.d., Mucinex 16 mg  q.d., temazepam 30 mg q.h.s. p.r.n., Vicodin 10/660 one q. six p.r.n.,  Robaxin 500 mg t.i.d., __________ p.r.n., NitroQuick 0.4 mg sublingual  p.r.n.   PAST MEDICAL HISTORY:  Several year history of scleroderma, chronic GERD,  bronchial asthma, hypertension, coronary artery disease.  She is status post  four-vessel coronary artery bypass graft in May 2001 at which time she had  benign mediastinal lesion removed.  She has hyperlipidemia, chronic  constipation and rectal prolapse.  Other surgeries include cholecystectomy,  hysterectomy, surgery on her left foot for fracture, decompression of left  carpal tunnel.   Her GI history is reviewed above.  She also has insomnia, anxiety, neurosis  and depression.   SOCIAL HISTORY:  She is widowed.  She has two daughters and a son, one of  her daughters and [place].  She is retired.  She has never smoked cigarettes  or drank alcohol.   FAMILY HISTORY:  Noncontributory.  Her father had CAD, one brother died of  liver cancer, another one of brain cancer.   PHYSICAL EXAM:  Pleasant well-developed, well-nourished Caucasian female who  is quite anxious but in no distress.  She weighs 161.5 pound.  She is 5 feet 4 inches tall.  Pulse 52 per minute, blood pressure 110/78, temperature  is  98.4.  HEENT:  Conjunctivae is pink.  Sclera is nonicteric.  Oral pharyngeal mucosa  is normal.  Teeth in fair condition, some are missing.  Pharyngeal reflex is  intact.  NECK:  No neck masses or thyromegaly noted.  CARDIAC:  Exam with regular rhythm.  Normal S1 S3.  She has faint systolic  section murmur best heard at LLSB.  LUNGS:  Clear to auscultation.  ABDOMEN:  Is full, bowel sounds are normal.  No bruits noted, on palpation  is soft.  She has tenderness in both iliac fossae, slightly greater on left  than on the right.  No hepatosplenomegaly noted.  RECTAL:  Examination reveals large skin tag anteriorly, no prolapse is  evident.  Digital exam is normal and stool is guaiac negative.  She has trace edema around her ankles.   ASSESSMENT:  Jalayne is a 77 year old Caucasian female with several year  history of scleroderma, chronic GERD, who previously has been documented to  have impaired esophageal peristalsis and low LES pressure, who presents with  progressive dysphagia.  Her dysphagia appears to be both pharyngeal as well  as esophageal.  Before she is evaluated for esophageal dysphagia, she needs  to be reevaluated for oropharyngeal dysphagia.  While she has had a modified  barium study in past, it is unclear to me how long ago she had she was seen  by speech pathologist but I feel this needs to be redone.  Not mentioned  above, her heartburn is well-controlled with PPI.   Recurrent lower abdominal pain.  Her abdominal exam os relatively benign and  stool is guaiac negative.  This is possibly IBS; however, this could be IBS  but other conditions need to be ruled out.   RECOMMENDATIONS:  She will continue Protonix as before.  We will arrange for  a consultation with speech pathologist as she would need modified barium  swallow etc.  She will also undergo abdominal pelvic CT with oral IV  contrast.  We will request a copy of her recent lab studies from Dr.  Annitta Jersey  office.  She may need a BUN and creatinine prior to CT.   She was given prescription for Protonix 40 mg q.a.m. 30 with 11 refills.  She was also given prescription for temazepam 30 mg q.h.s. p.r.n. 30 without  refill, but this should hold her until she can make an appointment to see  Dr. Eliberto Ivory.   We appreciate the opportunity to participate in the care of this nice lady.      Lionel December, M.D.  Electronically Signed     NR/MEDQ  D:  08/22/2006  T:  08/23/2006  Job:  [id number]

## 2011-04-14 NOTE — H&P (Signed)
[place]. [facility]  Patient:    Colleen Perry, Colleen Perry Visit Number: [id number] MRN: [id number]          Service Type: Attending:  D. Karle Plumber, M.D. Dictated by:   Durenda Age, P.A.-C. Adm. Date:  10/17/01                           History and Physical  DATE OF BIRTH:  Apr 02, 1932  CHIEF COMPLAINT:  Substernal thyroid goiter.  HISTORY OF PRESENT ILLNESS:  Colleen Perry is a pleasant 77 year old white female referred by Dr. Doyne Keel for evaluation of substernal thyroid goiter. The patient was experiencing dysphagia which prompted her to be evaluated by Dr. Marcy Salvo.  Among several tests, a barium swallow was performed revealing some neck compression in the area.  A follow-up CT revealed a superior mediastinal mass consistent with a thyroid goiter with marked enlargement of the left thyroid lobe in the substernal position causing deviation of the trachea and compression of the trachea on the right side, the mass measuring about 3.7 x 4.3 cm.  This mass is also compressing the esophagus and responsible for this dysphagia.  PFTs showed an FVC of 1.89 and an FEV1 of 1.32.  Dr. Edwyna Shell was ready to proceed with left thyroidectomy in September 17, 2001; however, her primary care physician detected guaiac positive stools which prompted an EGD.  Although no records, per patient report "polyps in the stomach were discovered."  Apparently, that episode has been resolved and the patient is now stable for surgery, which is scheduled for October 17, 2001. Other than the symptoms mentioned above, the patient is experiencing cough with some sputum production secondary to sinusitis.  No chills.  She also has some shortness of breath with dyspnea on exertion secondary to trachea deviation, and she has chronic GERD symptoms.  PAST MEDICAL HISTORY:  Hypertension, hypercholesterolemia, anxiety, GERD - esophageal stricture with periodic dilatations, history of morphea  (form of scleroderma), chronic pain syndrome, history of bronchial asthma, CAD status post CABG, status post MI in May 2001, left thyroid goiter as mentioned above, prolapsed rectum secondary to scleroderma, decreased hearing, possible gastric polyps per EGD (no records).  PAST SURGICAL HISTORY:  Status post cholecystectomy in 1998.  Status post CABG x 4 by Dr. Donata Clay in May 2001.  Status post revision and debridement of the left secondary to cellulitis post CABG in June 2001.  Status post gastrostomy. Status post hysterectomy.  Status post left carpal tunnel syndrome.  Status post left foot fracture repair.  Status post removal of a facial skin tumor. Status post EGD by Dr. Daisy Lazar in October 2002.  MEDICATIONS:  1. Metoprolol 25 mg b.i.d.  2. Lasix 40 mg q.d.  3. Hydrocodone 5/500 two p.o. q.4-6h. p.r.n. for pain.  4. Zocor 20 mg q.d.  5. K-Dur unknown dose, possibly 20 mEq q.d.  6. Coated aspirin q.d.  7. Oxazepam 10 mg p.o. q.h.s. p.r.n.  8. Protonix 40 mg q.d.  9. Alprazolam 0.5 mg q.i.d. p.r.n. 10. Dulcolax two p.o. q.h.s. 11. Albuterol inhaler two puffs q.4h. p.r.n. 12. Flovent two puffs b.i.d.  ALLERGIES:  No known drug allergies.  REVIEW OF SYSTEMS:  See HPI and past medical history for significant positives.  She also experiences hoarseness and nausea at times.  No angina or arrhythmias.  No symptoms of TIA, CVA, amaurosis fugax.  No recent history of pneumonia.  No PE or DVT.  She has mentioned  difficulty for wound healing as well as increased sensitivity to pain secondary to morphea.  FAMILY HISTORY:  Mother died of CVA at 31.  Father died of MI at 67.  Two brothers died of cancer - one of brain of cancer and the other one of liver cancer.  SOCIAL HISTORY:  Widowed, three children - one of them is a Engineer, civil (consulting).  She is a housewife.  She denies any tobacco or alcohol intake.  PHYSICAL EXAMINATION:  GENERAL:  This is an obese 77 year old anxious white female in no  acute distress.  Alert and oriented x 3.  VITAL SIGNS:  Blood pressure 160/80, pulse 60, respirations 18.  HEENT:  Normocephalic, atraumatic.  PERRLA, EOMI.  Funduscopic exam within normal limits.  NECK:  Supple.  No JVD, bruits, or lymphadenopathy.  The thyroid cannot be palpated.  CHEST:  Symmetrical on inspiration.  LUNGS:  Clear to auscultation with exception of mild wheezing in the right lower lobe.  CARDIOVASCULAR:  Regular rate and rhythm with a well healed sternotomy site. There is an area of the sternotomy which is still mildly tender.  Short 1/6 systolic murmur in the left sternal border with no radiation.  Questionable S4.  No rubs.  ABDOMEN:  Obese, nontender, soft.  Bowel sounds x 4.  No masses or bruits.  GENITOURINARY AND RECTAL:  Deferred, followed by primary care physician.  EXTREMITIES:  No clubbing, positive for onychomycosis.  No cyanosis.  There is a chronic left ankle edema.  This ankle is also much larger than the right, but this is chronic.  No ulcerations.  Somewhat decreased temperature in her hands bilaterally.  PERIPHERAL PULSES:  Carotid through distal pulses 2+ bilaterally.  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  Of note, the patient has fairly good skin turgor despite her history of scleroderma.  NEUROLOGIC:  Other than the head involving mild resting tremor, no other findings are seen.  DTRs 3+ on the left, 2+ on the right.  Muscle strength 4/5 bilaterally.  ASSESSMENT AND PLAN:  The patient has substernal thyroid goiter.  She will undergo substernal thyroidectomy at [facility] on October 17, 2001 by Dr. Edwyna Shell.  Dr. Edwyna Shell has seen and evaluated this patient prior to the admission and has explained the risks and benefits involved in the procedure, and the patient has agreed to continue. Dictated by:   Durenda Age, P.A.-C. Attending:  D. Karle Plumber, M.D. DD:  10/15/01  TD:  10/15/01 Job: [id number]

## 2011-05-04 ENCOUNTER — Ambulatory Visit (INDEPENDENT_AMBULATORY_CARE_PROVIDER_SITE_OTHER): Payer: Medicare Other | Admitting: Internal Medicine

## 2011-05-04 DIAGNOSIS — B3789 Other sites of candidiasis: Secondary | ICD-10-CM

## 2012-05-27 ENCOUNTER — Telehealth (INDEPENDENT_AMBULATORY_CARE_PROVIDER_SITE_OTHER): Payer: Self-pay | Admitting: *Deleted

## 2012-05-27 NOTE — Telephone Encounter (Signed)
[name] Higgs the patient's daughter called and ask the following questions. 1. Patient would like to know if she may have another EGD/ED she is not able to swallow even small amounts 2. [name] ask if a patch or atropine drops could be ordered to help her. She,Karen may be reached at  [facility] [phone] , or home number [phone]

## 2012-05-28 NOTE — Telephone Encounter (Signed)
OK to schedule patient for EGD possible ED.

## 2012-05-29 NOTE — Telephone Encounter (Signed)
Colleen Perry please post for a Wednesday , so that her daughter [name], can be with her. EGD possible ED per NUR. The patient is having Dysphagia

## 2012-05-29 NOTE — Telephone Encounter (Signed)
[name] (Patient's daughter) was called and made aware this morning that it would the week of July 8 th.

## 2012-06-03 ENCOUNTER — Other Ambulatory Visit (INDEPENDENT_AMBULATORY_CARE_PROVIDER_SITE_OTHER): Payer: Self-pay | Admitting: *Deleted

## 2012-06-03 ENCOUNTER — Encounter (INDEPENDENT_AMBULATORY_CARE_PROVIDER_SITE_OTHER): Payer: Self-pay | Admitting: *Deleted

## 2012-06-03 DIAGNOSIS — R1310 Dysphagia, unspecified: Secondary | ICD-10-CM

## 2012-06-03 NOTE — Telephone Encounter (Signed)
EGD/ED sch'd 06/19/12 at 1200, patient daughter [name] aware of appt

## 2012-06-05 ENCOUNTER — Encounter (HOSPITAL_COMMUNITY): Payer: Self-pay | Admitting: Pharmacy Technician

## 2012-06-18 MED ORDER — SODIUM CHLORIDE 0.45 % IV SOLN
Freq: Once | INTRAVENOUS | Status: AC
  Administered 2012-06-19: 12:00:00 via INTRAVENOUS

## 2012-06-19 ENCOUNTER — Encounter (HOSPITAL_COMMUNITY)
Admission: RE | Disposition: A | Discharge status: Home / Self Care | Payer: Self-pay | Source: Ambulatory Visit | Attending: Internal Medicine

## 2012-06-19 ENCOUNTER — Ambulatory Visit (HOSPITAL_COMMUNITY)
Admission: RE | Admit: 2012-06-19 | Discharge: 2012-06-19 | Disposition: A | Discharge status: Home / Self Care | Payer: Medicare Other | Source: Ambulatory Visit | Attending: Internal Medicine | Admitting: Internal Medicine

## 2012-06-19 ENCOUNTER — Encounter (HOSPITAL_COMMUNITY): Payer: Self-pay

## 2012-06-19 DIAGNOSIS — B3781 Candidal esophagitis: Secondary | ICD-10-CM

## 2012-06-19 DIAGNOSIS — R1310 Dysphagia, unspecified: Secondary | ICD-10-CM

## 2012-06-19 DIAGNOSIS — Z431 Encounter for attention to gastrostomy: Secondary | ICD-10-CM | POA: Insufficient documentation

## 2012-06-19 DIAGNOSIS — K9423 Gastrostomy malfunction: Secondary | ICD-10-CM

## 2012-06-19 DIAGNOSIS — B379 Candidiasis, unspecified: Secondary | ICD-10-CM | POA: Insufficient documentation

## 2012-06-19 HISTORY — DX: Myoneural disorder, unspecified: G70.9

## 2012-06-19 HISTORY — DX: Essential (primary) hypertension: I10

## 2012-06-19 HISTORY — DX: Anxiety disorder, unspecified: F41.9

## 2012-06-19 SURGERY — ESOPHAGOGASTRODUODENOSCOPY (EGD) WITH ESOPHAGEAL DILATION
Anesthesia: Moderate Sedation

## 2012-06-19 MED ORDER — SCOPOLAMINE 1 MG/3DAYS TD PT72: 1.0000 | MEDICATED_PATCH | TRANSDERMAL | Status: DC

## 2012-06-19 MED ORDER — FLUCONAZOLE 100 MG PO TABS: 100.0000 mg | ORAL_TABLET | Freq: Every day | ORAL | Status: AC

## 2012-06-19 MED ORDER — BUTAMBEN-TETRACAINE-BENZOCAINE 2-2-14 % EX AERO
INHALATION_SPRAY | CUTANEOUS | Status: DC | PRN
  Administered 2012-06-19: 3 via TOPICAL

## 2012-06-19 MED ORDER — MIDAZOLAM HCL 5 MG/5ML IJ SOLN
INTRAMUSCULAR | Status: DC | PRN
  Administered 2012-06-19 (×2): 2 mg via INTRAVENOUS
  Administered 2012-06-19: 1 mg via INTRAVENOUS

## 2012-06-19 MED ORDER — MEPERIDINE HCL 25 MG/ML IJ SOLN
INTRAMUSCULAR | Status: DC | PRN
  Administered 2012-06-19 (×2): 25 mg via INTRAVENOUS

## 2012-06-19 MED ORDER — MIDAZOLAM HCL 5 MG/5ML IJ SOLN
INTRAMUSCULAR | Status: AC
  Filled 2012-06-19: qty 10

## 2012-06-19 MED ORDER — MEPERIDINE HCL 50 MG/ML IJ SOLN
INTRAMUSCULAR | Status: AC
  Filled 2012-06-19: qty 1

## 2012-06-19 MED ORDER — STERILE WATER FOR IRRIGATION IR SOLN
Status: DC | PRN
  Administered 2012-06-19: 12:00:00

## 2012-06-19 NOTE — H&P (Signed)
Colleen Perry is an 78 y.o. female.   Chief Complaint: Asian is here for EGD by ED and PEG change. HPI: Patient is 78 year old Caucasian female with multiple medical problems including scleroderma who had  PEG placed about 3 years ago for oropharyngeal dysphagia and continued weight loss. She has been on intermittent or bolus feeding. She has been able to take a few foods by mouth. Lately her dysphagia has gotten worse. She is also having some issues with 2. It had to be cut recently to make it function. Therefore a request for this to be changed. Patient is accompanied by her daughter Ms. Colleen Perry an RN who is in agreement.  Past Medical History  Diagnosis Date  . Hypertension   . Anxiety   . Neuromuscular disorder     Past Surgical History  Procedure Date  . Cardiac surgery   . Coronary artery bypass graft   . Ankle surgery     40 years ago  . Cholecystectomy     15 years ago  . Abdominal hysterectomy     History reviewed. No pertinent family history. Social History:  reports that she has never smoked. She does not have any smokeless tobacco history on file. She reports that she does not drink alcohol or use illicit drugs.  Allergies: No Known Allergies  Medications Prior to Admission  Medication Sig Dispense Refill  . ALPRAZolam (XANAX) 1 MG tablet Take 1 mg by mouth 4 (four) times daily as needed. For anxiety      . amLODipine-benazepril (LOTREL) 5-20 MG per capsule Take 1 capsule by mouth daily.      . bisacodyl (DULCOLAX) 5 MG EC tablet Take 5 mg by mouth daily as needed. For constipation      . Fluticasone-Salmeterol (ADVAIR) 250-50 MCG/DOSE AEPB Inhale 1 puff into the lungs every 12 (twelve) hours.      Marland Kitchen HYDROcodone-acetaminophen (VICODIN) 5-500 MG per tablet Take 1 tablet by mouth every 6 (six) hours as needed. For pain      . naphazoline (CLEAR EYES) 0.012 % ophthalmic solution Place 1 drop into both eyes 4 (four) times daily as needed. For allergies      .  pantoprazole (PROTONIX) 40 MG tablet Take 40 mg by mouth daily.      . simvastatin (ZOCOR) 20 MG tablet Take 20 mg by mouth every evening.      . traZODone (DESYREL) 100 MG tablet Take 50-150 mg by mouth at bedtime.         No results found for this or any previous visit (from the past 48 hour(s)). No results found.  Review of Systems  Skin: Positive for rash.    Pulse 68, temperature 97.6 F (36.4 C), temperature source Oral, resp. rate 12, height 5\' 3"  (1.6 m), weight 135 lb (61.236 kg). Physical Exam  Constitutional: She appears well-developed and well-nourished.  HENT:  Mouth/Throat: Oropharynx is clear and moist.       She has  few remaining teeth partially broken  Eyes: Conjunctivae are normal. No scleral icterus.  Neck: No thyromegaly present.  Cardiovascular: Normal rate, regular rhythm and normal heart sounds.   Murmur: grade 2/6 systolic ejection murmur heard at LLSB. Respiratory: Effort normal and breath sounds normal.  GI: Soft. She exhibits no distension and no mass. There is no tenderness.       G-tube in place which shows signs of wear and tear and has  lot of black debris in it  Musculoskeletal: She  exhibits no edema.  Lymphadenopathy:    She has no cervical adenopathy.  Neurological: She is alert.  Skin: Skin is warm and dry.     Assessment/Plan Dysphagia. Malfunctioning PEG. EGD possible EGD and PEG change.  REHMAN,NAJEEB U 06/19/2012, 11:50 AM

## 2012-06-19 NOTE — Op Note (Signed)
EGD PROCEDURE REPORT  PATIENT:  Colleen Perry  MR#:  [id number] Birthdate:  1931-12-18, 78 y.o., female Endoscopist:  Dr. Malissa Hippo, MD Referred By:  Dr. Ignatius Specking, MD Procedure Date: 06/19/2012  Procedure:   EGD with PEG change.  Indications:  Patient is 78 year old Caucasian female who has pharyngeal and esophageal dysphagia secondary to scleroderma as noted worsening dysphagia. She also desires to have tube changed because it is showing signs of air and tear.            Informed Consent:  The risks, benefits, alternatives & imponderables which include, but are not limited to, bleeding, infection, perforation, drug reaction and potential missed lesion have been reviewed.  The potential for biopsy, lesion removal, esophageal dilation, etc. have also been discussed.  Questions have been answered.  All parties agreeable.  Please see history & physical in medical record for more information.  Medications:  Demerol 50 mg IV Versed 5 mg IV Cetacaine spray topically for oropharyngeal anesthesia  Description of procedure:  The endoscope was introduced through the mouth and advanced to the second portion of the duodenum without difficulty or limitations. The mucosal surfaces were surveyed very carefully during advancement of the scope and upon withdrawal.  Findings:  Hypopharynx; appears secretions noted in hypopharynx along with thick exudate consistent with candidiasis. Esophagus:  Thick cheesy exudate involving the proximal two thirds of the esophagus. Stomach:  Stomach was empty and distended well with insufflation. G-tube was covered with thick material in lumen was small. Mucosae at body, antrum pyloric channel as well as fundus and cardia was normal. Duodenum:  Submucosal lesion noted along lateral or right side of bulb. This lesion previously has been evaluated and consistent with submucosal leiomyoma. Os bulbar mucosa was normal.  Therapeutic/Diagnostic Maneuvers Performed:   G-tube was incised close to the skin and post in to the gastric lumen. Known replacement G-tube was advanced with the fistula. Balloon was filled with 5 ML of sterile water. Old G-tube was caught with snare and removed along with the scope. On initial attempt I was not able to remove this tube. Patient's O2 sat dropped and she was placed on Ventimask. Once O2 sat was above 90% endoscope was passed again and old G-tube was removed.  Complications:  None  Impression: Oropharyngeal and esophageal candidiasis. Copious secretions in hypopharynx implying severe hypopharyngeal dysmotility. Old G-tube was removed as above and replaced with a balloon replacement gastrostomy tube.  Recommendations:  Diflucan 200 mg today and then 100 mg daily for total of 2 weeks. All medications should be given via PEG. She needs to be n.p.o.. Dietary consultation for recommendations regarding tube feeding.  Cloyde Oregel U  06/19/2012  12:54 PM  CC: Dr. Ignatius Specking., MD & Dr. Bonnetta Barry ref. provider found

## 2012-06-19 NOTE — Op Note (Signed)
Scope out at 1220-scope back in  at 1238

## 2012-06-20 ENCOUNTER — Telehealth (INDEPENDENT_AMBULATORY_CARE_PROVIDER_SITE_OTHER): Payer: Self-pay | Admitting: *Deleted

## 2012-06-20 NOTE — Telephone Encounter (Signed)
Colleen Perry from [facility] called and states that patient called her to say that she had her tube changed yesterday. The patient cannot feed with this new tube, the syringes are not fitting, Colleen Perry feels that she may hay the mickey button. She may be reached at [phone]. Per Dr.Rehman he discussed this with the patient's daughter , [name]. They need to go and purchase an adaptor for the tube Also we need to order a oropharyngeal suction set up Patient has a lot of oral secretions

## 2012-06-20 NOTE — Telephone Encounter (Signed)
I called Marcelino Duster back and gave to her the information per Dr.Rehman, she had additional questions, I provided Endo's number and his pager number for her to address with him. The patient and family per per Marcelino Duster states that they were not given nay instructions regarding tube,ect. Dr.Rehman talked with the patient's daughter, [name].

## 2012-08-21 ENCOUNTER — Other Ambulatory Visit (INDEPENDENT_AMBULATORY_CARE_PROVIDER_SITE_OTHER): Payer: Self-pay | Admitting: Internal Medicine

## 2013-01-22 ENCOUNTER — Telehealth (INDEPENDENT_AMBULATORY_CARE_PROVIDER_SITE_OTHER): Payer: Self-pay | Admitting: *Deleted

## 2013-01-22 NOTE — Telephone Encounter (Signed)
[name] [phone]) made an apt for her mother with Terri on Monday 01/27/13 while Dr. Karilyn Cota was in office.

## 2013-01-22 NOTE — Telephone Encounter (Signed)
Dr.Rehman was paged and this has been forwarded to him .

## 2013-01-22 NOTE — Telephone Encounter (Signed)
Per Dr.Rehman the patient will need to be seen before he could answer this.

## 2013-01-22 NOTE — Telephone Encounter (Signed)
Lupita Leash make an appointment for the patient to se either Terri or Dr.Rehman

## 2013-01-22 NOTE — Telephone Encounter (Signed)
[name], patient's daughter, LM stating her mother went to the ED on Sunday with abd pain. They did an x-ray which showed no stool only air in the feeding tube. Anely called [name] again today stating she is having a lot of  abd pain on the left side of her feeding tube. They would like to know what Dr. Karilyn Cota would suggest them to do? The return phone number is [phone].

## 2013-01-23 NOTE — Telephone Encounter (Signed)
Already addressed

## 2013-01-24 ENCOUNTER — Telehealth (INDEPENDENT_AMBULATORY_CARE_PROVIDER_SITE_OTHER): Payer: Self-pay | Admitting: *Deleted

## 2013-01-24 NOTE — Telephone Encounter (Signed)
Noted  

## 2013-01-24 NOTE — Telephone Encounter (Signed)
Colleen Perry LM confirmating apt for Monday, 01/27/13. She also said Capricia's feeding tube came out yesterday, 01/23/13. Return the call and spoke with Colleen Perry and advised to report the feeding tube coming out to her Home Health Nurse. 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 Her father had one and she also worked with it in the hospital.

## 2013-01-27 ENCOUNTER — Encounter (INDEPENDENT_AMBULATORY_CARE_PROVIDER_SITE_OTHER): Payer: Self-pay | Admitting: Internal Medicine

## 2013-01-27 ENCOUNTER — Ambulatory Visit (INDEPENDENT_AMBULATORY_CARE_PROVIDER_SITE_OTHER): Payer: Medicare Other | Admitting: Internal Medicine

## 2013-01-27 VITALS — BP 104/68 | HR 76 | Temp 98.5°F | Resp 16 | Ht 62.0 in | Wt 133.4 lb

## 2013-01-27 DIAGNOSIS — R1314 Dysphagia, pharyngoesophageal phase: Secondary | ICD-10-CM

## 2013-01-27 DIAGNOSIS — I10 Essential (primary) hypertension: Secondary | ICD-10-CM | POA: Insufficient documentation

## 2013-01-27 DIAGNOSIS — I2510 Atherosclerotic heart disease of native coronary artery without angina pectoris: Secondary | ICD-10-CM | POA: Insufficient documentation

## 2013-01-27 DIAGNOSIS — L309 Dermatitis, unspecified: Secondary | ICD-10-CM

## 2013-01-27 DIAGNOSIS — F419 Anxiety disorder, unspecified: Secondary | ICD-10-CM | POA: Insufficient documentation

## 2013-01-27 DIAGNOSIS — M349 Systemic sclerosis, unspecified: Secondary | ICD-10-CM | POA: Insufficient documentation

## 2013-01-27 DIAGNOSIS — Z87898 Personal history of other specified conditions: Secondary | ICD-10-CM

## 2013-01-27 DIAGNOSIS — L259 Unspecified contact dermatitis, unspecified cause: Secondary | ICD-10-CM

## 2013-01-27 MED ORDER — NYSTATIN-TRIAMCINOLONE 100000-0.1 UNIT/GM-% EX CREA: TOPICAL_CREAM | Freq: Two times a day (BID) | CUTANEOUS | Status: DC

## 2013-01-27 NOTE — Patient Instructions (Signed)
Notify if abdominal pain recurs. Notify if current G-tube starts clogging up.

## 2013-01-27 NOTE — Progress Notes (Signed)
Presenting complaint;  Left upper quadrant abdominal pain.  Subjective:  Patient is 79 year old Caucasian female who is here for scheduled visit accompanied by her daughter [name] who is an Charity fundraiser. Patient has 50 year history of scleroderma and had PEG placed about 3 years ago for oropharyngeal dysphagia and weight loss. She she was changed in July 2013. Patient's daughter called last week that her mother was having left upper quadrant abdominal pain. She was apparently seen in emergency room at [facility] 8 days ago and had blood work and acute abdominal series and no abnormality was noted. Last week her tube came out and she has not had any more abdominal pain. Her daughter passed foleys catheter to keep the stoma opening. She is using this tube on when necessary basis. She is getting most of her calories via oral feeding. She has chronic cough. She has not required hospitalization or antibiotics for respiratory problems. She has lost about 3 pounds in the last 8 months. She denies nausea vomiting melena or rectal bleeding. She also has not experienced fever or chills. Patient lives at home with help of care in and her other sister.   Current Medications: Current Outpatient Prescriptions  Medication Sig Dispense Refill  . ALPRAZolam (XANAX) 1 MG tablet Take 1 mg by mouth 4 (four) times daily as needed. For anxiety      . amLODipine-benazepril (LOTREL) 5-20 MG per capsule Take 1 capsule by mouth daily.      . bisacodyl (DULCOLAX) 5 MG EC tablet Take 5 mg by mouth daily as needed. For constipation      . Fluticasone-Salmeterol (ADVAIR) 250-50 MCG/DOSE AEPB Inhale 1 puff into the lungs every 12 (twelve) hours.      . furosemide (LASIX) 40 MG tablet Take by mouth as needed.      Marland Kitchen HYDROcodone-acetaminophen (VICODIN) 5-500 MG per tablet Take 1 tablet by mouth every 6 (six) hours as needed. For pain      . naphazoline (CLEAR EYES) 0.012 % ophthalmic solution Place 1 drop into both eyes 4 (four) times daily as  needed. For allergies      . pantoprazole (PROTONIX) 40 MG tablet TAKE ONE TABLET BY MOUTH EVERY MORNING  30 tablet  11  . simvastatin (ZOCOR) 20 MG tablet Take 20 mg by mouth every evening.      . traZODone (DESYREL) 100 MG tablet Take 50-150 mg by mouth at bedtime.        No current facility-administered medications for this visit.     Objective: Blood pressure 104/68, pulse 76, temperature 98.5 F (36.9 C), temperature source Oral, resp. rate 16, height 5\' 2"  (1.575 m), weight 133 lb 6.4 oz (60.51 kg). Patient is alert and in no acute distress. She is unable to clear her throat of secretions. Conjunctiva is pink. Sclera is nonicteric Oropharyngeal mucosa is normal. No neck masses or thyromegaly noted. Cardiac exam with regular rhythm normal S1 and S2. Grade 3/6 systolic ejection murmur best heard at aortic area. Lungs are clear to auscultation. Abdomen. She has Foleys catheter in place. Catheter was too far advanced and was pulled out. There is erythema and exudate involving the peristomal skin. Abdomen is very soft and nontender without organomegaly or masses.  No LE edema or clubbing noted.    Assessment:   #1. Recent episode of LUQ abdominal pain. Evaluation in emergency room 8 days ago was unremarkable. Now she is pain-free. I doubt that this pain has anything to do with her gastrostomy tube which  she's had for several months and was last changed in July 2013. If pain recurs will proceed with abdominopelvic CT. #2. 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   Plan:  Reviewed records from [facility] when available. Call if abdominal pain recurs. Mycolog-II cream to be applied to peristomal skin twice daily until rash resolves and then on as-needed basis. Will change  G-tube when current tube(Foleys catheter) starts clogging. I also advised to keep gentle pull on Foleys catheter so that it would not migrate into duodenum and  cause duodenal obstruction. Office visit on when necessary basis.

## 2013-01-30 ENCOUNTER — Telehealth (INDEPENDENT_AMBULATORY_CARE_PROVIDER_SITE_OTHER): Payer: Self-pay | Admitting: *Deleted

## 2013-01-30 NOTE — Telephone Encounter (Signed)
Patient was seen in the office earlier this week. Dr.Rehman requested lab work from [facility] for review. He had attempted to call her daughter , [name] as well as myself. I reached her today and gave her the following information per Dr.Rehman, Hgb- 11.4 , X-Ray normal did not show anything . If the pain comes back she is to call our office and  a CT will be arranged. Karen's home number is [phone]

## 2013-02-02 ENCOUNTER — Telehealth: Payer: Self-pay | Admitting: Gastroenterology

## 2013-02-02 NOTE — Telephone Encounter (Signed)
PT HAS PEG TUBE. IT FELL OUT TODAY & CURRENTLY IS TAPING TUBE TO SKIN TO KEEP IT IN PLACE. ASKED MS. Denbleyker TO BRING PT TO SHORT STAY FOR PEG CHANGE.

## 2013-02-02 NOTE — Telephone Encounter (Signed)
PEG CHANGED IN ENDO TO 20 FR BS BALLOON.

## 2013-02-19 ENCOUNTER — Encounter (INDEPENDENT_AMBULATORY_CARE_PROVIDER_SITE_OTHER): Payer: Self-pay

## 2013-04-22 ENCOUNTER — Telehealth (INDEPENDENT_AMBULATORY_CARE_PROVIDER_SITE_OTHER): Payer: Self-pay | Admitting: *Deleted

## 2013-04-22 NOTE — Telephone Encounter (Signed)
Marcelino Duster from [facility] said they are needing a new tub feeding order faxed to [phone].  The phone number is [phone].

## 2013-04-22 NOTE — Telephone Encounter (Signed)
I called and spoke with [facility]. They are in need of a new order for the Ensure Plus as the patient told them that due to her weight gain,she had decreased it 3 cans per day. A new order was faxed to [facility].

## 2013-06-13 ENCOUNTER — Other Ambulatory Visit (HOSPITAL_COMMUNITY): Payer: Self-pay | Admitting: Internal Medicine

## 2013-06-13 DIAGNOSIS — R109 Unspecified abdominal pain: Secondary | ICD-10-CM

## 2013-06-18 ENCOUNTER — Ambulatory Visit (HOSPITAL_COMMUNITY): Payer: Medicare Other

## 2013-07-20 ENCOUNTER — Emergency Department (HOSPITAL_COMMUNITY): Payer: Medicare Other

## 2013-07-20 ENCOUNTER — Encounter (HOSPITAL_COMMUNITY): Payer: Self-pay | Admitting: *Deleted

## 2013-07-20 ENCOUNTER — Emergency Department (HOSPITAL_COMMUNITY)
Admission: EM | Admit: 2013-07-20 | Discharge: 2013-07-20 | Disposition: A | Discharge status: Home / Self Care | Payer: Medicare Other | Attending: Emergency Medicine | Admitting: Emergency Medicine

## 2013-07-20 DIAGNOSIS — F411 Generalized anxiety disorder: Secondary | ICD-10-CM | POA: Insufficient documentation

## 2013-07-20 DIAGNOSIS — Z8739 Personal history of other diseases of the musculoskeletal system and connective tissue: Secondary | ICD-10-CM | POA: Insufficient documentation

## 2013-07-20 DIAGNOSIS — Y929 Unspecified place or not applicable: Secondary | ICD-10-CM | POA: Insufficient documentation

## 2013-07-20 DIAGNOSIS — Z79899 Other long term (current) drug therapy: Secondary | ICD-10-CM | POA: Insufficient documentation

## 2013-07-20 DIAGNOSIS — Y939 Activity, unspecified: Secondary | ICD-10-CM | POA: Insufficient documentation

## 2013-07-20 DIAGNOSIS — IMO0002 Reserved for concepts with insufficient information to code with codable children: Secondary | ICD-10-CM | POA: Insufficient documentation

## 2013-07-20 DIAGNOSIS — Z951 Presence of aortocoronary bypass graft: Secondary | ICD-10-CM | POA: Insufficient documentation

## 2013-07-20 DIAGNOSIS — I10 Essential (primary) hypertension: Secondary | ICD-10-CM | POA: Insufficient documentation

## 2013-07-20 DIAGNOSIS — W19XXXA Unspecified fall, initial encounter: Secondary | ICD-10-CM | POA: Insufficient documentation

## 2013-07-20 DIAGNOSIS — Z8669 Personal history of other diseases of the nervous system and sense organs: Secondary | ICD-10-CM | POA: Insufficient documentation

## 2013-07-20 DIAGNOSIS — S62109A Fracture of unspecified carpal bone, unspecified wrist, initial encounter for closed fracture: Secondary | ICD-10-CM | POA: Insufficient documentation

## 2013-07-20 HISTORY — DX: Systemic sclerosis, unspecified: M34.9

## 2013-07-20 MED ORDER — HYDROCODONE-ACETAMINOPHEN 5-325 MG PO TABS: 1.0000 | ORAL_TABLET | Freq: Four times a day (QID) | ORAL | Status: DC | PRN

## 2013-07-20 NOTE — ED Notes (Signed)
Pt fell around 7pm and c/o pain to her right wrist and forearm.

## 2013-07-20 NOTE — ED Provider Notes (Signed)
CSN: [id number]     Arrival date & time 07/20/13  2047 History     First MD Initiated Contact with Patient 07/20/13 2126     Chief Complaint  Patient presents with  . Wrist Pain   (Consider location/radiation/quality/duration/timing/severity/associated sxs/prior Treatment) Patient is a 79 y.o. female presenting with wrist pain. The history is provided by the patient (the pt fell on her right wrist).  Wrist Pain This is a new problem. The current episode started 3 to 5 hours ago. The problem occurs constantly. The problem has not changed since onset.Pertinent negatives include no chest pain, no abdominal pain and no headaches. Exacerbated by: movement. Nothing relieves the symptoms.    Past Medical History  Diagnosis Date  . Hypertension   . Anxiety   . Neuromuscular disorder   . Scleroderma    Past Surgical History  Procedure Laterality Date  . Cardiac surgery    . Coronary artery bypass graft    . Ankle surgery      40 years ago  . Cholecystectomy      15 years ago  . Abdominal hysterectomy     History reviewed. No pertinent family history. History  Substance Use Topics  . Smoking status: Never Smoker   . Smokeless tobacco: Never Used  . Alcohol Use: No   OB History   Grav Para Term Preterm Abortions TAB SAB Ect Mult Living                 Review of Systems  Constitutional: Negative for appetite change and fatigue.  HENT: Negative for congestion, sinus pressure and ear discharge.   Eyes: Negative for discharge.  Respiratory: Negative for cough.   Cardiovascular: Negative for chest pain.  Gastrointestinal: Negative for abdominal pain and diarrhea.  Genitourinary: Negative for frequency and hematuria.  Musculoskeletal: Negative for back pain.       Wrist pain  Skin: Negative for rash.  Neurological: Negative for seizures and headaches.  Psychiatric/Behavioral: Negative for hallucinations.    Allergies  Review of patient's allergies indicates no known  allergies.  Home Medications   Current Outpatient Rx  Name  Route  Sig  Dispense  Refill  . ALPRAZolam (XANAX) 1 MG tablet   Oral   Take 1 mg by mouth 4 (four) times daily as needed. For anxiety         . amLODipine-benazepril (LOTREL) 5-20 MG per capsule   Oral   Take 1 capsule by mouth daily.         . bisacodyl (DULCOLAX) 5 MG EC tablet   Oral   Take 5 mg by mouth daily as needed. For constipation         . Fluticasone-Salmeterol (ADVAIR) 250-50 MCG/DOSE AEPB   Inhalation   Inhale 1 puff into the lungs every 12 (twelve) hours.         . furosemide (LASIX) 40 MG tablet   Oral   Take by mouth as needed.         Marland Kitchen HYDROcodone-acetaminophen (NORCO/VICODIN) 5-325 MG per tablet   Oral   Take 1 tablet by mouth every 6 (six) hours as needed for pain.         Marland Kitchen HYDROcodone-acetaminophen (NORCO/VICODIN) 5-325 MG per tablet   Oral   Take 1 tablet by mouth every 6 (six) hours as needed for pain.   20 tablet   0   . naphazoline (CLEAR EYES) 0.012 % ophthalmic solution   Both Eyes   Place 1 drop  into both eyes 4 (four) times daily as needed. For allergies         . nystatin-triamcinolone (MYCOLOG II) cream   Topical   Apply topically 2 (two) times daily. Apply cream around G-tube is daily until irritation resolved; then use it on as-needed basis   60 g   0   . pantoprazole (PROTONIX) 40 MG tablet      TAKE ONE TABLET BY MOUTH EVERY MORNING   30 tablet   11     Generic WUJ:[id number]   40MG  patient request refill   . simvastatin (ZOCOR) 20 MG tablet   Oral   Take 20 mg by mouth every evening.         . traZODone (DESYREL) 100 MG tablet   Oral   Take 50-150 mg by mouth at bedtime.           BP 149/84  Pulse 89  Temp(Src) 98 F (36.7 C) (Oral)  Resp 16  Ht 5\' 4"  (1.626 m)  Wt 130 lb (58.968 kg)  BMI 22.3 kg/m2  SpO2 98% Physical Exam  Constitutional: She is oriented to person, place, and time. She appears well-developed.  HENT:  Head:  Normocephalic.  Eyes: Conjunctivae are normal.  Neck: No tracheal deviation present.  Cardiovascular:  No murmur heard. Musculoskeletal:  Tender swollen right wrist,  Neuro vasc normal  Neurological: She is oriented to person, place, and time.  Skin: Skin is warm.  Psychiatric: She has a normal mood and affect.    ED Course   Procedures (including critical care time)  Labs Reviewed - No data to display Dg Wrist Complete Right  07/20/2013   *RADIOLOGY REPORT*  Clinical Data: Post fall, now with wrist pain  RIGHT WRIST - COMPLETE 3+ VIEW  Comparison: None.  Findings:  There is a minimally displaced slightly impacted fracture involving the distal radius, apex volar, without definitive intra-articular extension.  Sequela of prior remote displaced ulnar styloid process fracture. There is a suspected superimposed acute fracture involving the distal aspect of the ulna without definite intra-articular extension.  Expected adjacent soft tissue swelling and displacement of the pronator quadratus fat pad.  Vascular calcifications.  No radiopaque foreign body.  IMPRESSION: 1.  Minimally displaced fractures involving the distal radius and ulna without definite intra-articular extension  2.  Old/remote displaced ulnar styloid process fracture.   Original Report Authenticated By: Tacey Ruiz, MD   1. Wrist fracture, closed, right, closed, initial encounter     MDM    Benny Lennert, MD 07/20/13 2135

## 2013-07-20 NOTE — ED Notes (Signed)
Patient given discharge instruction, verbalized understand. Patient in wheelchair out of the department with daughters.

## 2013-07-21 ENCOUNTER — Ambulatory Visit (INDEPENDENT_AMBULATORY_CARE_PROVIDER_SITE_OTHER): Payer: Medicare Other | Admitting: Orthopedic Surgery

## 2013-07-21 ENCOUNTER — Encounter: Payer: Self-pay | Admitting: Orthopedic Surgery

## 2013-07-21 DIAGNOSIS — S52531A Colles' fracture of right radius, initial encounter for closed fracture: Secondary | ICD-10-CM | POA: Insufficient documentation

## 2013-07-21 DIAGNOSIS — S52539A Colles' fracture of unspecified radius, initial encounter for closed fracture: Secondary | ICD-10-CM

## 2013-07-21 NOTE — Progress Notes (Signed)
Patient ID: Colleen Perry, female   DOB: 1932-07-20, 79 y.o.   MRN: [id number]  Chief Complaint  Patient presents with  . Wrist Injury    Right wrist pain fell August 24    79 year old female multiple medical problems felt weak fell at home went down slowly onto her right wrist complaint of pain and swelling. She was sent to the emergency room for treatment and followup. She complains of throbbing 10 out of 10 constant pain over the right distal radius shows laceration/skin tear over the right elbow denies provoking or alleviating factors denies numbness tingling locking catching or swelling  The past, family history and social history have been reviewed and are recorded in the corresponding sections of epic   Examination shows a fairly well-developed poorly nourished female oriented x3 splint on the right wrist mood and affect flat ambulating today with wheelchair use a walker at home right wrist no deformity swelling decreased range of motion. Stability confirmed. Strength normal. Skin tear right elbow good pulse no lymph nodes normal sensation no pathologic reflexes  Balance could not be assessed  X-ray shows nondisplaced fracture distal radius possibly also ulna  Recommend application short arm cast  X-ray out of plaster 6 weeks

## 2013-07-21 NOTE — Patient Instructions (Signed)
KEEP CAST DRY

## 2013-07-22 ENCOUNTER — Telehealth: Payer: Self-pay | Admitting: Orthopedic Surgery

## 2013-07-22 NOTE — Telephone Encounter (Signed)
Called patient's daughter and advised her to bring patient in tomorrow morning 07/23/13 at 9:30am for evaluation of cast and fingers swelling. Appointment made.

## 2013-07-22 NOTE — Telephone Encounter (Signed)
Otillia Wollard's daughter, Kenney Houseman called concerned about Colleen Perry's fingers swelling. I offered for her to come in to have you check it, but she could not bring her today.  Told her to have her to elevate and ice .  Kenney Houseman also said she is not wearing the sling.  Kenney Houseman will call tomorrow morning early if she thinks she needs to be checked   Tanya's # [phone]

## 2013-07-23 ENCOUNTER — Ambulatory Visit (INDEPENDENT_AMBULATORY_CARE_PROVIDER_SITE_OTHER): Payer: Medicare Other | Admitting: Orthopedic Surgery

## 2013-07-23 VITALS — BP 115/54 | Ht 64.0 in | Wt 130.0 lb

## 2013-07-23 DIAGNOSIS — S52531D Colles' fracture of right radius, subsequent encounter for closed fracture with routine healing: Secondary | ICD-10-CM

## 2013-07-23 DIAGNOSIS — S5290XD Unspecified fracture of unspecified forearm, subsequent encounter for closed fracture with routine healing: Secondary | ICD-10-CM

## 2013-07-23 NOTE — Progress Notes (Signed)
Patient ID: Colleen Perry, female   DOB: 12-May-1932, 79 y.o.   MRN: [id number]  Complains of tightness of the cast status post casting for a nondisplaced distal radius fracture a few days back  Cast bivalved pressure relieved  Neurovascular exam intact  Keep regular appointment for x-rays and cast off

## 2013-07-23 NOTE — Patient Instructions (Addendum)
Keep regular appointment

## 2013-09-02 ENCOUNTER — Ambulatory Visit (INDEPENDENT_AMBULATORY_CARE_PROVIDER_SITE_OTHER): Payer: Medicare Other

## 2013-09-02 ENCOUNTER — Ambulatory Visit (INDEPENDENT_AMBULATORY_CARE_PROVIDER_SITE_OTHER): Payer: Self-pay | Admitting: Orthopedic Surgery

## 2013-09-02 ENCOUNTER — Encounter: Payer: Self-pay | Admitting: Orthopedic Surgery

## 2013-09-02 VITALS — BP 135/70 | Ht 64.0 in | Wt 130.0 lb

## 2013-09-02 DIAGNOSIS — S52531D Colles' fracture of right radius, subsequent encounter for closed fracture with routine healing: Secondary | ICD-10-CM

## 2013-09-02 DIAGNOSIS — IMO0001 Reserved for inherently not codable concepts without codable children: Secondary | ICD-10-CM

## 2013-09-02 DIAGNOSIS — S62101D Fracture of unspecified carpal bone, right wrist, subsequent encounter for fracture with routine healing: Secondary | ICD-10-CM

## 2013-09-02 DIAGNOSIS — S5290XD Unspecified fracture of unspecified forearm, subsequent encounter for closed fracture with routine healing: Secondary | ICD-10-CM

## 2013-09-02 NOTE — Progress Notes (Signed)
Patient ID: Colleen Perry, female   DOB: Jan 31, 1932, 78 y.o.   MRN: [id number]  Encounter Diagnoses  Name Primary?  . Right wrist fracture, with routine healing, subsequent encounter Yes  . Colles' fracture of right radius, closed, with routine healing, subsequent encounter     Chief Complaint  Patient presents with  . Follow-up    6 week recheck right wrist fracture DOI 07/20/13    Blood pressure 135/70, height 5\' 4"  (1.626 m), weight 130 lb (58.968 kg).  Cast off x-ray shows healing of the intra-articular distal radius and ulnar fracture  The clinical exam shows the patient has full range of motion of the digits of the right hand and minimal discomfort over the fracture site. Elbow is normal  Recommend splint right wrist to support weight bearing with walker  Wear brace for one month followup as needed

## 2013-09-02 NOTE — Patient Instructions (Addendum)
Wear splint x 1 month

## 2013-10-25 ENCOUNTER — Other Ambulatory Visit: Payer: Self-pay

## 2013-10-25 ENCOUNTER — Emergency Department (HOSPITAL_COMMUNITY)
Admission: EM | Admit: 2013-10-25 | Discharge: 2013-10-26 | Disposition: A | Discharge status: Home / Self Care | Payer: Medicare Other | Attending: Emergency Medicine | Admitting: Emergency Medicine

## 2013-10-25 ENCOUNTER — Emergency Department (HOSPITAL_COMMUNITY): Payer: Medicare Other

## 2013-10-25 ENCOUNTER — Encounter (HOSPITAL_COMMUNITY): Payer: Self-pay | Admitting: Emergency Medicine

## 2013-10-25 DIAGNOSIS — E871 Hypo-osmolality and hyponatremia: Secondary | ICD-10-CM | POA: Insufficient documentation

## 2013-10-25 DIAGNOSIS — R109 Unspecified abdominal pain: Secondary | ICD-10-CM | POA: Insufficient documentation

## 2013-10-25 DIAGNOSIS — N390 Urinary tract infection, site not specified: Secondary | ICD-10-CM | POA: Insufficient documentation

## 2013-10-25 HISTORY — DX: Chronic cough: R05.3

## 2013-10-25 HISTORY — DX: Cough: R05

## 2013-10-25 HISTORY — DX: Unspecified abdominal pain: R10.9

## 2013-10-25 HISTORY — DX: Dysphagia, oropharyngeal phase: R13.12

## 2013-10-25 LAB — LIPASE, BLOOD: Lipase: 24 U/L (ref 11–59)

## 2013-10-25 LAB — CBC WITH DIFFERENTIAL/PLATELET
Basophils Relative: 0 % (ref 0–1)
HCT: 35.1 % — ABNORMAL LOW (ref 36.0–46.0)
Hemoglobin: 11.8 g/dL — ABNORMAL LOW (ref 12.0–15.0)
MCHC: 33.6 g/dL (ref 30.0–36.0)
MCV: 84.4 fL (ref 78.0–100.0)
Monocytes Absolute: 0.4 10*3/uL (ref 0.1–1.0)
Monocytes Relative: 6 % (ref 3–12)
Neutro Abs: 5.3 10*3/uL (ref 1.7–7.7)

## 2013-10-25 LAB — COMPREHENSIVE METABOLIC PANEL
Albumin: 3.2 g/dL — ABNORMAL LOW (ref 3.5–5.2)
BUN: 24 mg/dL — ABNORMAL HIGH (ref 6–23)
CO2: 29 mEq/L (ref 19–32)
Chloride: 90 mEq/L — ABNORMAL LOW (ref 96–112)
Creatinine, Ser: 1.09 mg/dL (ref 0.50–1.10)
GFR calc non Af Amer: 47 mL/min — ABNORMAL LOW (ref >90)
Total Bilirubin: 0.3 mg/dL (ref 0.3–1.2)

## 2013-10-25 LAB — URINALYSIS W MICROSCOPIC + REFLEX CULTURE
Bilirubin Urine: NEGATIVE
Hgb urine dipstick: NEGATIVE
Ketones, ur: NEGATIVE mg/dL
Protein, ur: NEGATIVE mg/dL
Specific Gravity, Urine: <1.005 — ABNORMAL LOW (ref 1.005–1.030)
Urobilinogen, UA: 0.2 mg/dL (ref 0.0–1.0)

## 2013-10-25 LAB — LACTIC ACID, PLASMA: Lactic Acid, Venous: 1 mmol/L (ref 0.5–2.2)

## 2013-10-25 MED ORDER — IOHEXOL 300 MG/ML  SOLN
100.0000 mL | Freq: Once | INTRAMUSCULAR | Status: AC | PRN
  Administered 2013-10-25: 100 mL via INTRAVENOUS

## 2013-10-25 MED ORDER — DEXTROSE 5 % IV SOLN
1.0000 g | Freq: Once | INTRAVENOUS | Status: AC
  Administered 2013-10-25: 1 g via INTRAVENOUS
  Filled 2013-10-25: qty 10

## 2013-10-25 MED ORDER — DEXTROSE 5 % IV SOLN: 1.0000 g | Freq: Once | INTRAVENOUS | Status: DC

## 2013-10-25 MED ORDER — SODIUM CHLORIDE 0.9 % IV SOLN
INTRAVENOUS | Status: DC
  Administered 2013-10-25: 1000 mL via INTRAVENOUS

## 2013-10-25 MED ORDER — IOHEXOL 300 MG/ML  SOLN
50.0000 mL | Freq: Once | INTRAMUSCULAR | Status: AC | PRN
  Administered 2013-10-25: 50 mL via ORAL

## 2013-10-25 MED ORDER — FAMOTIDINE IN NACL 20-0.9 MG/50ML-% IV SOLN
20.0000 mg | Freq: Once | INTRAVENOUS | Status: AC
  Administered 2013-10-25: 20 mg via INTRAVENOUS
  Filled 2013-10-25: qty 50

## 2013-10-25 NOTE — ED Provider Notes (Signed)
CSN: [id number]     Arrival date & time 10/25/13  1847 History   First MD Initiated Contact with Patient 10/25/13 1935     Chief Complaint  Patient presents with  . Abdominal Pain  . Nausea  . Chest Pain    HPI Pt was seen at 1945.   Per pt, c/o gradual onset and persistence of waxing and waning left sided abd "pain" for the past 8 months. Pain has become constant over the past 2 days.  Has been associated with nausea.  Denies vomiting/diarrhea, no fevers, no back pain, no rash, no CP/SOB, no black or blood in stools.       Past Medical History  Diagnosis Date  . Hypertension   . Anxiety   . Neuromuscular disorder   . Scleroderma   . Chronic cough   . Oropharyngeal dysphagia   . Recurrent abdominal pain    Past Surgical History  Procedure Laterality Date  . Cardiac surgery    . Coronary artery bypass graft    . Ankle surgery      40 years ago  . Cholecystectomy      15 years ago  . Abdominal hysterectomy    . Feeding tube placement      History  Substance Use Topics  . Smoking status: Never Smoker   . Smokeless tobacco: Never Used  . Alcohol Use: No    Review of Systems ROS: Statement: All systems negative except as marked or noted in the HPI; Constitutional: Negative for fever and chills. ; ; Eyes: Negative for eye pain, redness and discharge. ; ; ENMT: Negative for ear pain, hoarseness, nasal congestion, sinus pressure and sore throat. ; ; Cardiovascular: Negative for chest pain, palpitations, diaphoresis, dyspnea and peripheral edema. ; ; Respiratory: Negative for cough, wheezing and stridor. ; ; Gastrointestinal: +abd pain, nausea. Negative for vomiting, diarrhea, blood in stool, hematemesis, jaundice and rectal bleeding. . ; ; Genitourinary: Negative for dysuria, flank pain and hematuria. ; ; Musculoskeletal: Negative for back pain and neck pain. Negative for swelling and trauma.; ; Skin: Negative for pruritus, rash, abrasions, blisters, bruising and skin lesion.; ;  Neuro: Negative for headache, lightheadedness and neck stiffness. Negative for weakness, altered level of consciousness , altered mental status, extremity weakness, paresthesias, involuntary movement, seizure and syncope.      Allergies  Review of patient's allergies indicates no known allergies.  Home Medications   Current Outpatient Rx  Name  Route  Sig  Dispense  Refill  . ALPRAZolam (XANAX) 1 MG tablet   Oral   Take 1 mg by mouth 4 (four) times daily as needed. For anxiety         . benazepril (LOTENSIN) 40 MG tablet   Oral   Take 40 mg by mouth every morning.         . bisacodyl (DULCOLAX) 5 MG EC tablet   Oral   Take 5 mg by mouth at bedtime. For constipation         . furosemide (LASIX) 40 MG tablet   Oral   Take 40-80 mg by mouth every morning.          Marland Kitchen HYDROcodone-acetaminophen (NORCO) 7.5-325 MG per tablet   Oral   Take 1 tablet by mouth 4 (four) times daily as needed for moderate pain or severe pain.         . Rivaroxaban (XARELTO) 15 MG TABS tablet   Oral   Take 15 mg by mouth every morning.         Marland Kitchen  simvastatin (ZOCOR) 20 MG tablet   Oral   Take 20 mg by mouth every morning.          . naphazoline (CLEAR EYES) 0.012 % ophthalmic solution   Both Eyes   Place 1 drop into both eyes 4 (four) times daily as needed. For allergies          BP 119/62  Pulse 70  Temp(Src) 98.1 F (36.7 C) (Oral)  Resp 20  Ht 5\' 4"  (1.626 m)  Wt 132 lb (59.875 kg)  BMI 22.65 kg/m2  SpO2 100% Physical Exam 1950: Physical examination:  Nursing notes reviewed; Vital signs and O2 SAT reviewed;  Constitutional: Well developed, Well nourished, Well hydrated, In no acute distress; Head:  Normocephalic, atraumatic; Eyes: EOMI, PERRL, No scleral icterus; ENMT: Mouth and pharynx normal, Mucous membranes moist; Neck: Supple, Full range of motion, No lymphadenopathy; Cardiovascular: Regular rate and rhythm, No gallop; Respiratory: Breath sounds clear & equal  bilaterally, No wheezes.  Speaking full sentences with ease, Normal respiratory effort/excursion; Chest: Nontender, Movement normal; Abdomen: +PEG tube in place. Soft, Nontender, Nondistended, Normal bowel sounds; Genitourinary: No CVA tenderness; Extremities: Pulses normal, No tenderness, No edema, No calf edema or asymmetry.; Neuro: AA&Ox3, vague historian. Major CN grossly intact.  Speech clear. No gross focal motor or sensory deficits in extremities.; Skin: Color normal, Warm, Dry.   ED Course  Procedures     EKG Interpretation   None       MDM  MDM Reviewed: previous chart, nursing note and vitals Reviewed previous: labs Interpretation: labs and ECG    Date: 10/25/2013  Rate: 61  Rhythm: normal sinus rhythm  QRS Axis: normal  Intervals: normal  ST/T Wave abnormalities: normal  Conduction Disutrbances:none  Narrative Interpretation:   Old EKG Reviewed: none available.   Results for orders placed during the hospital encounter of 10/25/13  CBC WITH DIFFERENTIAL      Result Value Range   WBC 7.4  4.0 - 10.5 K/uL   RBC 4.16  3.87 - 5.11 MIL/uL   Hemoglobin 11.8 (*) 12.0 - 15.0 g/dL   HCT 16.1 (*) 09.6 - 04.5 %   MCV 84.4  78.0 - 100.0 fL   MCH 28.4  26.0 - 34.0 pg   MCHC 33.6  30.0 - 36.0 g/dL   RDW 40.9  81.1 - 91.4 %   Platelets 283  150 - 400 K/uL   Neutrophils Relative % 71  43 - 77 %   Neutro Abs 5.3  1.7 - 7.7 K/uL   Lymphocytes Relative 23  12 - 46 %   Lymphs Abs 1.7  0.7 - 4.0 K/uL   Monocytes Relative 6  3 - 12 %   Monocytes Absolute 0.4  0.1 - 1.0 K/uL   Eosinophils Relative 0  0 - 5 %   Eosinophils Absolute 0.0  0.0 - 0.7 K/uL   Basophils Relative 0  0 - 1 %   Basophils Absolute 0.0  0.0 - 0.1 K/uL  COMPREHENSIVE METABOLIC PANEL      Result Value Range   Sodium 128 (*) 135 - 145 mEq/L   Potassium 4.3  3.5 - 5.1 mEq/L   Chloride 90 (*) 96 - 112 mEq/L   CO2 29  19 - 32 mEq/L   Glucose, Bld 109 (*) 70 - 99 mg/dL   BUN 24 (*) 6 - 23 mg/dL    Creatinine, Ser 7.82  0.50 - 1.10 mg/dL   Calcium 9.4  8.4 - 95.6  mg/dL   Total Protein 6.8  6.0 - 8.3 g/dL   Albumin 3.2 (*) 3.5 - 5.2 g/dL   AST 20  0 - 37 U/L   ALT 9  0 - 35 U/L   Alkaline Phosphatase 123 (*) 39 - 117 U/L   Total Bilirubin 0.3  0.3 - 1.2 mg/dL   GFR calc non Af Amer 47 (*) >90 mL/min   GFR calc Af Amer 54 (*) >90 mL/min  LIPASE, BLOOD      Result Value Range   Lipase 24  11 - 59 U/L  LACTIC ACID, PLASMA      Result Value Range   Lactic Acid, Venous 1.0  0.5 - 2.2 mmol/L  TROPONIN I      Result Value Range   Troponin I <0.30  <0.30 ng/mL    2140:  Pt told Triage RN that she felt "weird" in her chest. Denies this to me during my evaluation. Doubt ACS as cause for symptoms with normal troponin and EKG without acute STTW changes after 2 days of constant symptoms. Na 128 today; previous labs dated 01/19/13 with Na 133. CT scan, CXR, and UA pending. Sign out to Dr. Rubin Payor.        Laray Anger, DO 10/25/13 2144

## 2013-10-25 NOTE — ED Notes (Addendum)
Pt c/o lower abdominal pain and nausea x 1 day. Pt has a feeding tube in place. Pt also c/o feeling weird in her chest and some sob. Request EKG.

## 2013-10-25 NOTE — ED Notes (Signed)
Instilled 1 bottle of oral contrast dye into pt's peg tube. Pt tolerated well, no complaints.

## 2013-10-26 MED ORDER — CEPHALEXIN 500 MG PO CAPS: 500.0000 mg | ORAL_CAPSULE | Freq: Two times a day (BID) | ORAL | Status: DC

## 2013-10-26 NOTE — ED Provider Notes (Signed)
CT scan reassuring. Mild hyponatremia. Patient has PEG tube. Does have a UTI. Patient feels as if she would like to go home. We'll give IV antibiotics and oral antibiotics for home. Will follow with her PCP  Juliet Rude. Rubin Payor, MD 10/26/13 4098

## 2013-10-28 LAB — URINE CULTURE

## 2013-10-29 ENCOUNTER — Encounter (HOSPITAL_COMMUNITY): Payer: Self-pay | Admitting: Emergency Medicine

## 2013-10-29 ENCOUNTER — Emergency Department (HOSPITAL_COMMUNITY): Payer: Medicare Other

## 2013-10-29 ENCOUNTER — Emergency Department (HOSPITAL_COMMUNITY)
Admission: EM | Admit: 2013-10-29 | Discharge: 2013-10-29 | Disposition: A | Discharge status: Home / Self Care | Payer: Medicare Other | Attending: Emergency Medicine | Admitting: Emergency Medicine

## 2013-10-29 ENCOUNTER — Other Ambulatory Visit (INDEPENDENT_AMBULATORY_CARE_PROVIDER_SITE_OTHER): Payer: Self-pay | Admitting: *Deleted

## 2013-10-29 DIAGNOSIS — Z8669 Personal history of other diseases of the nervous system and sense organs: Secondary | ICD-10-CM | POA: Insufficient documentation

## 2013-10-29 DIAGNOSIS — F411 Generalized anxiety disorder: Secondary | ICD-10-CM | POA: Insufficient documentation

## 2013-10-29 DIAGNOSIS — Z431 Encounter for attention to gastrostomy: Secondary | ICD-10-CM

## 2013-10-29 DIAGNOSIS — Z951 Presence of aortocoronary bypass graft: Secondary | ICD-10-CM | POA: Insufficient documentation

## 2013-10-29 DIAGNOSIS — I10 Essential (primary) hypertension: Secondary | ICD-10-CM | POA: Insufficient documentation

## 2013-10-29 DIAGNOSIS — Z79899 Other long term (current) drug therapy: Secondary | ICD-10-CM | POA: Insufficient documentation

## 2013-10-29 DIAGNOSIS — T85698A Other mechanical complication of other specified internal prosthetic devices, implants and grafts, initial encounter: Secondary | ICD-10-CM | POA: Insufficient documentation

## 2013-10-29 DIAGNOSIS — Z8739 Personal history of other diseases of the musculoskeletal system and connective tissue: Secondary | ICD-10-CM | POA: Insufficient documentation

## 2013-10-29 DIAGNOSIS — T85598A Other mechanical complication of other gastrointestinal prosthetic devices, implants and grafts, initial encounter: Secondary | ICD-10-CM

## 2013-10-29 DIAGNOSIS — Y838 Other surgical procedures as the cause of abnormal reaction of the patient, or of later complication, without mention of misadventure at the time of the procedure: Secondary | ICD-10-CM | POA: Insufficient documentation

## 2013-10-29 NOTE — ED Notes (Signed)
Family reports feeding tube came out earlier tonight.  Tube is one with a balloon at end, family replaced tube.  Pt reports she has had abdominal pain all day.  Denies new pain from tube being dislodged.

## 2013-10-29 NOTE — ED Provider Notes (Signed)
CSN: [id number]     Arrival date & time 10/29/13  0116 History   First MD Initiated Contact with Patient 10/29/13 0129     No chief complaint on file.  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 She reports her mother was having trouble with her feeding tube in that it was stopping up and not working well today and then tonight the tube came out. Daughter states she put it back in place to keep the hole intact. She states the balloon would not inflate. The current feeding tube have been placed by Dr. Renae Fickle about a year ago.  PCP Dr Sherril Croon GI Dr Karilyn Cota  Past Medical History  Diagnosis Date  . Hypertension   . Anxiety   . Neuromuscular disorder   . Scleroderma   . Chronic cough   . Oropharyngeal dysphagia   . Recurrent abdominal pain    Past Surgical History  Procedure Laterality Date  . Cardiac surgery    . Coronary artery bypass graft    . Ankle surgery      40 years ago  . Cholecystectomy      15 years ago  . Abdominal hysterectomy    . Feeding tube placement     History reviewed. No pertinent family history. History  Substance Use Topics  . Smoking status: Never Smoker   . Smokeless tobacco: Never Used  . Alcohol Use: No  lives at home  Lives alone   [place] History   Grav Para Term Preterm Abortions TAB SAB Ect Mult Living                 Review of Systems  All other systems reviewed and are negative.    Allergies  Review of patient's allergies indicates no known allergies.  Home Medications   Current Outpatient Rx  Name  Route  Sig  Dispense  Refill  . ALPRAZolam (XANAX) 1 MG tablet   Oral   Take 1 mg by mouth 4 (four) times daily as needed. For anxiety         . benazepril (LOTENSIN) 40 MG tablet   Oral   Take 40 mg by mouth every morning.         .  bisacodyl (DULCOLAX) 5 MG EC tablet   Oral   Take 5 mg by mouth at bedtime. For constipation         . cephALEXin (KEFLEX) 500 MG capsule   Oral   Take 1 capsule (500 mg total) by mouth 2 (two) times daily.   14 capsule   0   . furosemide (LASIX) 40 MG tablet   Oral   Take 40-80 mg by mouth every morning.          Marland Kitchen HYDROcodone-acetaminophen (NORCO) 7.5-325 MG per tablet   Oral   Take 1 tablet by mouth 4 (four) times daily as needed for moderate pain or severe pain.         . naphazoline (CLEAR EYES) 0.012 % ophthalmic solution   Both Eyes   Place 1 drop into both eyes 4 (four) times daily as needed. For allergies         . Rivaroxaban (XARELTO) 15 MG TABS tablet   Oral   Take 15 mg by mouth every morning.         . simvastatin (ZOCOR) 20 MG tablet  Oral   Take 20 mg by mouth every morning.           BP 138/78  Pulse 68  Temp(Src) 98.2 F (36.8 C) (Oral)  Resp 16  Ht 5\' 4"  (1.626 m)  Wt 138 lb (62.596 kg)  BMI 23.68 kg/m2  SpO2 98%  Vital signs normal   Physical Exam  Nursing note and vitals reviewed. Constitutional: She is oriented to person, place, and time. She appears well-developed and well-nourished.  Non-toxic appearance. She does not appear ill. No distress.  HENT:  Head: Normocephalic and atraumatic.  Right Ear: External ear normal.  Left Ear: External ear normal.  Nose: Nose normal. No mucosal edema or rhinorrhea.  Mouth/Throat: Mucous membranes are normal. No dental abscesses or uvula swelling.  Several broken lower front teeth on the left  Eyes: Conjunctivae and EOM are normal.  Neck: Normal range of motion and full passive range of motion without pain. Neck supple.  Pulmonary/Chest: Effort normal. No respiratory distress. She has no rhonchi. She exhibits no crepitus.  Abdominal: Soft. Normal appearance and bowel sounds are normal. She exhibits no distension. There is no tenderness. There is no rebound and no guarding.  Prior feeding  tube in place.   Neurological: She is alert and oriented to person, place, and time. She has normal strength. No cranial nerve deficit.  Skin: Skin is warm, dry and intact. No rash noted. No erythema. No pallor.  Psychiatric: She has a normal mood and affect. Her speech is normal and behavior is normal. Her mood appears not anxious.    ED Course  Procedures (including critical care time)  I removed her old feeding tube without difficulty and reinserted a 20 gauge foley catheter without difficulty. Balloon filled with 10 cc of sterile saline.  Placement verified by xray.   We discussed getting standard feeding tube reinserted, however I found out there isn't IR here anymore. She would need to go to [facility] or [facility] or have her GI replace it. Pt will need to contact Dr Karilyn Cota, her GI who inserted her last feeding tube about a year ago.    Labs Review Labs Reviewed - No data to display Imaging Review Dg Abd 1 View  10/29/2013   CLINICAL DATA:  Feeding tube placement  EXAM: ABDOMEN - 1 VIEW  COMPARISON:  Prior CT from 10/25/2013  FINDINGS: Enteric contrast contour AL injected via a gastrostomy tube is seen opacifying the gastric body as well as several loops of proximal small bowel and the upper abdomen. No extraluminal contrast appreciated. No free intraperitoneal air.  Visualized bowel gas pattern is nonobstructive. Multiple calcified diverticular overlie the pelvis. Surgical clips compatible with prior cholecystectomy are present right upper quadrant.  Severe dextroscoliosis of the lumbar spine is noted. No acute osseous abnormality.  IMPRESSION: Enteric contrast material administered via a gastrostomy tube with opacification of stomach and proximal small bowel. No extraluminal contrast or free intraperitoneal air identified to suggest malpositioning.   Electronically Signed   By: Rise Mu M.D.   On: 10/29/2013 02:44    EKG Interpretation   None       MDM   1. Feeding tube  dysfunction, initial encounter     Plan discharge   Devoria Albe, MD, Franz Dell, MD 10/29/13 [phone]

## 2013-10-30 ENCOUNTER — Ambulatory Visit (HOSPITAL_COMMUNITY)
Admission: RE | Admit: 2013-10-30 | Discharge: 2013-10-30 | Disposition: A | Discharge status: Home Health | Payer: Medicare Other | Source: Ambulatory Visit | Attending: Internal Medicine | Admitting: Internal Medicine

## 2013-10-30 ENCOUNTER — Encounter (HOSPITAL_COMMUNITY)
Admission: RE | Disposition: A | Discharge status: Home Health | Payer: Self-pay | Source: Ambulatory Visit | Attending: Internal Medicine

## 2013-10-30 DIAGNOSIS — Z431 Encounter for attention to gastrostomy: Secondary | ICD-10-CM | POA: Insufficient documentation

## 2013-10-30 DIAGNOSIS — R627 Adult failure to thrive: Secondary | ICD-10-CM

## 2013-10-30 HISTORY — PX: PEG PLACEMENT: SHX5437

## 2013-10-30 SURGERY — REPLACEMENT, PEG TUBE, WITHOUT ENDOSCOPY
Anesthesia: LOCAL

## 2013-10-30 MED ORDER — NYSTATIN-TRIAMCINOLONE 100000-0.1 UNIT/GM-% EX CREA: 1.0000 "application " | TOPICAL_CREAM | Freq: Two times a day (BID) | CUTANEOUS | Status: DC

## 2013-10-30 MED ORDER — SODIUM CHLORIDE 0.9 % IV SOLN: INTRAVENOUS | Status: DC

## 2013-10-30 NOTE — H&P (Signed)
Colleen Perry is an 79 y.o. female.   Chief Complaint: Patient is here to have gastrostomy tube replaced. HPI: Patient is 79 year old Caucasian female with multiple medical problems including scleroderma and oropharyngeal dysphagia. She had PEG placed by me about 4 years ago. Gastrostomy tube was changed at EGD on 06/19/2012. G-tube came out accidentally night before last. Patient was brought to emergency room by her daughter and Foleys catheter was advanced into the stomach to keep stoma open. Contrast study was obtained and there was no extravasation of contrast. Our office was contacted to to place another gastrostomy tube. Patient has noted a some bleeding from gastrostomy site. She also complains of burning and discomfort that G. site.   Past Medical History  Diagnosis Date  . Hypertension   . Anxiety   . Neuromuscular disorder   . Scleroderma   . Chronic cough   . Oropharyngeal dysphagia   . Recurrent abdominal pain     Past Surgical History  Procedure Laterality Date  . Cardiac surgery    . Coronary artery bypass graft    . Ankle surgery      40 years ago  . Cholecystectomy      15 years ago  . Abdominal hysterectomy    . Feeding tube placement      No family history on file. Social History:  reports that she has never smoked. She has never used smokeless tobacco. She reports that she does not drink alcohol or use illicit drugs.  Allergies: No Known Allergies  Medications Prior to Admission  Medication Sig Dispense Refill  . ALPRAZolam (XANAX) 1 MG tablet Take 1 mg by mouth 4 (four) times daily as needed. For anxiety      . benazepril (LOTENSIN) 40 MG tablet Take 40 mg by mouth every morning.      . bisacodyl (DULCOLAX) 5 MG EC tablet Take 5 mg by mouth at bedtime. For constipation      . cephALEXin (KEFLEX) 500 MG capsule Take 1 capsule (500 mg total) by mouth 2 (two) times daily.  14 capsule  0  . furosemide (LASIX) 40 MG tablet Take 40-80 mg by mouth every  morning.       Marland Kitchen HYDROcodone-acetaminophen (NORCO) 7.5-325 MG per tablet Take 1 tablet by mouth 4 (four) times daily as needed for moderate pain or severe pain.      . naphazoline (CLEAR EYES) 0.012 % ophthalmic solution Place 1 drop into both eyes 4 (four) times daily as needed. For allergies      . Rivaroxaban (XARELTO) 15 MG TABS tablet Take 15 mg by mouth every morning.      . simvastatin (ZOCOR) 20 MG tablet Take 20 mg by mouth every morning.         No results found for this or any previous visit (from the past 48 hour(s)). Dg Abd 1 View  10/29/2013   CLINICAL DATA:  Feeding tube placement  EXAM: ABDOMEN - 1 VIEW  COMPARISON:  Prior CT from 10/25/2013  FINDINGS: Enteric contrast contour AL injected via a gastrostomy tube is seen opacifying the gastric body as well as several loops of proximal small bowel and the upper abdomen. No extraluminal contrast appreciated. No free intraperitoneal air.  Visualized bowel gas pattern is nonobstructive. Multiple calcified diverticular overlie the pelvis. Surgical clips compatible with prior cholecystectomy are present right upper quadrant.  Severe dextroscoliosis of the lumbar spine is noted. No acute osseous abnormality.  IMPRESSION: Enteric contrast material administered via a  gastrostomy tube with opacification of stomach and proximal small bowel. No extraluminal contrast or free intraperitoneal air identified to suggest malpositioning.   Electronically Signed   By: Rise Mu M.D.   On: 10/29/2013 02:44    ROS  Pulse 66, temperature 97.7 F (36.5 C), temperature source Oral, resp. rate 18, SpO2 98.00%. Physical Exam  Constitutional: She appears well-developed and well-nourished.  GI:  Abdominal exam reveals Foleys catheter in place. There is a granulation tissue at gastrostomy tube site. Abdomen is soft and nontender without organomegaly or masses.     Assessment/Plan Patient is here for placement of gastrostomy tube via existing  fistula.  REHMAN,NAJEEB U 10/30/2013, 3:04 PM

## 2013-10-30 NOTE — Op Note (Signed)
Procedure. gastrostomy tube replacement without endoscopy. Indication. Patient is 79 year old Caucasian female who has had pack for almost 4 years. This too was changed to July 2013. Lately tube has been clogging up. Night  before last tube came out accidentally and her daughter inserted back. Patient was seen in emergency room and 20 French Foley's catheter was advanced through the existing fistula in order to keep it open. Now she is here to have gastrostomy tube replaced. Findings; Procedure performed in the hospital. Abdominal exam revealed granulation tissue at gastrostomy site. There is some peristomal erythema. Foley's catheter was in place.  Balloon was deflated and Foleys catheter was removed. 20 French balloon replacement gastrostomy tube was advanced through existing fistula. The balloon was insufflated with 6 mL of sterile water. Gastric contents were aspirated through the tube. Position was also confirmed by injecting air resulting in typical gurgling sound. Bolster was moved closer to the skin. Site with was cleaned and covered with drain sponge. Patient tolerated the procedure well. 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 Patient has developed granulation tissue. This may have to be treated with topical silver nitrate. Recommendations; Resume gastric feeding as before. Tube should be flushed every time it is used to give medications. Mycolog-II cream to be applied to peristomal skin twice a day for 2 weeks and then when necessary. Will talk with her daughter about using silver nitrate to eradicate granulation tissue.

## 2013-11-05 ENCOUNTER — Encounter (HOSPITAL_COMMUNITY): Payer: Self-pay | Admitting: Internal Medicine

## 2014-03-09 ENCOUNTER — Telehealth (INDEPENDENT_AMBULATORY_CARE_PROVIDER_SITE_OTHER): Payer: Self-pay | Admitting: *Deleted

## 2014-03-09 NOTE — Telephone Encounter (Signed)
Would like Dr. Patty Sermonsehman's suggestion about [name]. She is still complaining with pain around her feeding tube. Before 3  please call [phone] and after 3 [phone].

## 2014-03-10 NOTE — Telephone Encounter (Signed)
Discussed with Dr.Rehman. Forwarded to him to call daughter.

## 2014-03-10 NOTE — Telephone Encounter (Signed)
Forwarded to Dr.Rehman to review. 

## 2014-03-17 ENCOUNTER — Telehealth (INDEPENDENT_AMBULATORY_CARE_PROVIDER_SITE_OTHER): Payer: Self-pay | Admitting: *Deleted

## 2014-03-17 ENCOUNTER — Other Ambulatory Visit (INDEPENDENT_AMBULATORY_CARE_PROVIDER_SITE_OTHER): Payer: Self-pay | Admitting: Internal Medicine

## 2014-03-17 MED ORDER — HYDROCODONE-ACETAMINOPHEN 5-300 MG PO TABS: 1.0000 | ORAL_TABLET | Freq: Four times a day (QID) | ORAL | Status: DC | PRN

## 2014-03-17 NOTE — Telephone Encounter (Signed)
Clydie BraunKaren, patient's daughter, said her mother is continuing to complain of stomach pain. Would like to know if Dr. Karilyn Cotaehman would send an order in for Care also [facility]. Ashtynn's weight is 123 lbs and she has also signed a DNR. An appointment was offered for today, 03/17/14, but Coy SaunasRosemary said she didn't feel like coming. Karen's return phone number is [phone].  [address] [place], [place] Phone: [phone] Fax: [phone]

## 2014-03-17 NOTE — Telephone Encounter (Signed)
Forwarded to Dr.Rehman to address, medication for discomfort. He has stated that a order for Hospice Care would need to be evaluated by patient's PCP.

## 2014-03-17 NOTE — Telephone Encounter (Signed)
Family cannot bring the patient to be evaluated. Will send prescription for Vicodin 5/300 4 times a day when necessary 40 doses

## 2014-03-19 NOTE — Telephone Encounter (Signed)
Has already been addressed 

## 2014-03-26 ENCOUNTER — Emergency Department (HOSPITAL_COMMUNITY)
Admission: EM | Admit: 2014-03-26 | Discharge: 2014-03-26 | Disposition: A | Discharge status: Home / Self Care | Payer: Medicare Other | Attending: Emergency Medicine | Admitting: Emergency Medicine

## 2014-03-26 ENCOUNTER — Emergency Department (HOSPITAL_COMMUNITY): Payer: Medicare Other

## 2014-03-26 ENCOUNTER — Encounter (HOSPITAL_COMMUNITY): Payer: Self-pay | Admitting: Emergency Medicine

## 2014-03-26 DIAGNOSIS — R110 Nausea: Secondary | ICD-10-CM | POA: Insufficient documentation

## 2014-03-26 DIAGNOSIS — R011 Cardiac murmur, unspecified: Secondary | ICD-10-CM | POA: Insufficient documentation

## 2014-03-26 DIAGNOSIS — K5900 Constipation, unspecified: Secondary | ICD-10-CM | POA: Insufficient documentation

## 2014-03-26 DIAGNOSIS — J40 Bronchitis, not specified as acute or chronic: Secondary | ICD-10-CM

## 2014-03-26 DIAGNOSIS — I10 Essential (primary) hypertension: Secondary | ICD-10-CM | POA: Insufficient documentation

## 2014-03-26 DIAGNOSIS — Z951 Presence of aortocoronary bypass graft: Secondary | ICD-10-CM | POA: Insufficient documentation

## 2014-03-26 DIAGNOSIS — J209 Acute bronchitis, unspecified: Secondary | ICD-10-CM | POA: Insufficient documentation

## 2014-03-26 DIAGNOSIS — Z8739 Personal history of other diseases of the musculoskeletal system and connective tissue: Secondary | ICD-10-CM | POA: Insufficient documentation

## 2014-03-26 DIAGNOSIS — Z8669 Personal history of other diseases of the nervous system and sense organs: Secondary | ICD-10-CM | POA: Insufficient documentation

## 2014-03-26 DIAGNOSIS — F411 Generalized anxiety disorder: Secondary | ICD-10-CM | POA: Insufficient documentation

## 2014-03-26 DIAGNOSIS — Z79899 Other long term (current) drug therapy: Secondary | ICD-10-CM | POA: Insufficient documentation

## 2014-03-26 LAB — CBC WITH DIFFERENTIAL/PLATELET
BASOS ABS: 0 10*3/uL (ref 0.0–0.1)
BASOS PCT: 0 % (ref 0–1)
EOS ABS: 0 10*3/uL (ref 0.0–0.7)
EOS PCT: 0 % (ref 0–5)
HEMATOCRIT: 41.2 % (ref 36.0–46.0)
Hemoglobin: 13.4 g/dL (ref 12.0–15.0)
Lymphocytes Relative: 7 % — ABNORMAL LOW (ref 12–46)
Lymphs Abs: 1 10*3/uL (ref 0.7–4.0)
MCH: 29 pg (ref 26.0–34.0)
MCHC: 32.5 g/dL (ref 30.0–36.0)
MCV: 89.2 fL (ref 78.0–100.0)
MONO ABS: 0.6 10*3/uL (ref 0.1–1.0)
Monocytes Relative: 4 % (ref 3–12)
Neutro Abs: 13.1 10*3/uL — ABNORMAL HIGH (ref 1.7–7.7)
Neutrophils Relative %: 89 % — ABNORMAL HIGH (ref 43–77)
Platelets: 336 10*3/uL (ref 150–400)
RBC: 4.62 MIL/uL (ref 3.87–5.11)
RDW: 13.7 % (ref 11.5–15.5)
WBC: 14.8 10*3/uL — ABNORMAL HIGH (ref 4.0–10.5)

## 2014-03-26 LAB — BASIC METABOLIC PANEL
BUN: 15 mg/dL (ref 6–23)
CALCIUM: 9.3 mg/dL (ref 8.4–10.5)
CO2: 30 mEq/L (ref 19–32)
Chloride: 95 mEq/L — ABNORMAL LOW (ref 96–112)
Creatinine, Ser: 0.81 mg/dL (ref 0.50–1.10)
GFR, EST AFRICAN AMERICAN: 77 mL/min — AB (ref >90)
GFR, EST NON AFRICAN AMERICAN: 67 mL/min — AB (ref >90)
Glucose, Bld: 103 mg/dL — ABNORMAL HIGH (ref 70–99)
Potassium: 4.4 mEq/L (ref 3.7–5.3)
Sodium: 134 mEq/L — ABNORMAL LOW (ref 137–147)

## 2014-03-26 LAB — TROPONIN I

## 2014-03-26 LAB — PRO B NATRIURETIC PEPTIDE: Pro B Natriuretic peptide (BNP): 1299 pg/mL — ABNORMAL HIGH (ref 0–450)

## 2014-03-26 MED ORDER — AMOXICILLIN 250 MG/5ML PO SUSR: 500.0000 mg | Freq: Three times a day (TID) | ORAL | Status: DC

## 2014-03-26 NOTE — ED Notes (Signed)
Pt's O2 in upper 80s to 95% with ambulation and talking. Pt's sat reading lower when gripping the walker. Pt able to ambulate and speak in complete sentences without SOB. Dr. Lynelle DoctorKnapp informed.

## 2014-03-26 NOTE — ED Provider Notes (Signed)
CSN: [id number]     Arrival date & time 03/26/14  1335 History  This chart was scribed for Ward GivensIva L Allison Deshotels, MD by Charline BillsEssence Howell, ED Scribe. The patient was seen in room APA18/APA18. Patient's care was started at 5:41 PM.    Chief Complaint  Patient presents with  . Shortness of Breath   The history is provided by the patient and a relative. No language interpreter was used.   HPI Comments: Colleen Perry is a 80 y.o. female who presents to the Emergency Department complaining of shortness of breath for 3 days. Pt's daughter reports associated congestion. They report she has had symptoms "for years" however her much discussion they did decide that got worse the last 3 days. She has had a central chest pain off and on however she relates it has been constant the past 3 days. Pt reports associated cough, chills (always feels cold), nausea, constipation, and intermittent central chest pain. Pt states that her chronic cough has worsened over the past 3 days. She denies fever and diarrhea. She is noted to be coughing up some yellow sputum in her cup. She states she has to carry a cup with her all the time to spin in.  Pt has had a feeding tube for approximately 5 years due to difficulty swallowing from Scleroderma. Pt's daughter is concerned with possible pneumonia.  Pt had quadruple bypass 14 years ago.  Pt lives independently. Pt ambulates with walker. Pt wears 3L O2 as needed at home and uses nebulizer.  PCP Dr Sherril CroonVyas  Past Medical History  Diagnosis Date  . Hypertension   . Anxiety   . Neuromuscular disorder   . Scleroderma   . Chronic cough   . Oropharyngeal dysphagia   . Recurrent abdominal pain    Past Surgical History  Procedure Laterality Date  . Cardiac surgery    . Coronary artery bypass graft    . Ankle surgery      40 years ago  . Cholecystectomy      15 years ago  . Abdominal hysterectomy    . Feeding tube placement    . Peg placement N/A 10/30/2013    Procedure:  PERCUTANEOUS ENDOSCOPIC GASTROSTOMY (PEG) REPLACEMENT;  Surgeon: Malissa HippoNajeeb U Rehman, MD;  Location: [facility];  Service: Endoscopy;  Laterality: N/A;  245   No family history on file. History  Substance Use Topics  . Smoking status: Never Smoker   . Smokeless tobacco: Never Used  . Alcohol Use: No  lives at home Lives alone with sitter 3 days a week   OB History   Grav Para Term Preterm Abortions TAB SAB Ect Mult Living                 Review of Systems  Constitutional: Positive for chills. Negative for fever.  HENT: Positive for congestion and trouble swallowing.   Respiratory: Positive for cough and shortness of breath.   Cardiovascular: Positive for chest pain.  Gastrointestinal: Positive for nausea and constipation. Negative for diarrhea.  All other systems reviewed and are negative.   Allergies  Review of patient's allergies indicates no known allergies.  Home Medications   Prior to Admission medications   Medication Sig Start Date End Date Taking? Authorizing Provider  ALPRAZolam Prudy Feeler(XANAX) 1 MG tablet Take 0.5-1 mg by mouth 4 (four) times daily as needed. For anxiety   Yes Historical Provider, MD  benazepril (LOTENSIN) 40 MG tablet Take 40 mg by mouth every morning.   Yes  Historical Provider, MD  bisacodyl (DULCOLAX) 5 MG EC tablet Take 5 mg by mouth at bedtime. For constipation   Yes Historical Provider, MD  furosemide (LASIX) 40 MG tablet Take 40-80 mg by mouth every morning.    Yes Historical Provider, MD  Hydrocodone-Acetaminophen (VICODIN) 5-300 MG TABS 1 tablet by PEG Tube route 4 (four) times daily as needed. 03/17/14  Yes Malissa HippoNajeeb U Rehman, MD  Menthol, Topical Analgesic, (BENGAY VANISHING SCENT) 2.5 % GEL Apply 1 application topically 3 (three) times daily as needed (pain).   Yes Historical Provider, MD  naphazoline (CLEAR EYES) 0.012 % ophthalmic solution Place 1 drop into both eyes 4 (four) times daily as needed. For allergies   Yes Historical Provider, MD   nystatin-triamcinolone (MYCOLOG II) cream Apply 1 application topically 2 (two) times daily. Applied to skin around the G-tube site twice daily for 2 weeks and then as needed 10/30/13  Yes Malissa HippoNajeeb U Rehman, MD  simvastatin (ZOCOR) 20 MG tablet Take 20 mg by mouth every morning.    Yes Historical Provider, MD  traZODone (DESYREL) 100 MG tablet Take 50-150 mg by mouth at bedtime.   Yes Historical Provider, MD   Triage Vitals: BP 122/64  Pulse 72  Temp(Src) 97.9 F (36.6 C) (Oral)  Resp 18  Ht 5\' 4"  (1.626 m)  Wt 127 lb (57.607 kg)  BMI 21.79 kg/m2  SpO2 94%  Vital signs normal   Physical Exam  Nursing note and vitals reviewed. Constitutional: She is oriented to person, place, and time. She appears well-developed and well-nourished.  Non-toxic appearance. She does not appear ill. No distress.  HENT:  Head: Normocephalic and atraumatic.  Right Ear: External ear normal.  Left Ear: External ear normal.  Nose: Nose normal. No mucosal edema or rhinorrhea.  Mouth/Throat: Oropharynx is clear and moist and mucous membranes are normal. No dental abscesses or uvula swelling.  Poor dentition  Eyes: Conjunctivae and EOM are normal. Pupils are equal, round, and reactive to light.  Neck: Normal range of motion and full passive range of motion without pain. Neck supple.  Cardiovascular: Normal rate and regular rhythm.  Exam reveals no gallop and no friction rub.   Murmur heard. Harsh systolic murmur heard best at right upper sternal border  Pulmonary/Chest: Effort normal and breath sounds normal. No respiratory distress. She has no wheezes. She has no rhonchi. She has no rales. She exhibits no tenderness and no crepitus.  Abdominal: Soft. Normal appearance and bowel sounds are normal. She exhibits no distension. There is no tenderness. There is no rebound and no guarding.  Musculoskeletal: Normal range of motion. She exhibits no edema and no tenderness.  Moves all extremities well.   Neurological:  She is alert and oriented to person, place, and time. She has normal strength. No cranial nerve deficit.  Skin: Skin is warm, dry and intact. No rash noted. No erythema. No pallor.  Psychiatric: She has a normal mood and affect. Her speech is normal and behavior is normal. Her mood appears not anxious.    ED Course  Procedures (including critical care time)    DIAGNOSTIC STUDIES: Oxygen Saturation is 94% on RA, adequate by my interpretation.    COORDINATION OF CARE: 5:53 PM-Discussed treatment plan which includes CXR with pt at bedside and pt agreed to plan.   Patient ambulated with her walker to the bathroom. The nurse reports her pulse ox remained in the 90% range. Nurse did report she dropped down to 89% however patient does have  oxygen she can use at home. Patient does not appear to be in any distress. She was felt to be stable to go home.  Patient will begin antibiotic to go through her gastric tube for bronchitis.  Labs Review Results for orders placed during the hospital encounter of 03/26/14  CBC WITH DIFFERENTIAL      Result Value Ref Range   WBC 14.8 (*) 4.0 - 10.5 K/uL   RBC 4.62  3.87 - 5.11 MIL/uL   Hemoglobin 13.4  12.0 - 15.0 g/dL   HCT 14.7  82.9 - 56.2 %   MCV 89.2  78.0 - 100.0 fL   MCH 29.0  26.0 - 34.0 pg   MCHC 32.5  30.0 - 36.0 g/dL   RDW 13.0  86.5 - 78.4 %   Platelets 336  150 - 400 K/uL   Neutrophils Relative % 89 (*) 43 - 77 %   Neutro Abs 13.1 (*) 1.7 - 7.7 K/uL   Lymphocytes Relative 7 (*) 12 - 46 %   Lymphs Abs 1.0  0.7 - 4.0 K/uL   Monocytes Relative 4  3 - 12 %   Monocytes Absolute 0.6  0.1 - 1.0 K/uL   Eosinophils Relative 0  0 - 5 %   Eosinophils Absolute 0.0  0.0 - 0.7 K/uL   Basophils Relative 0  0 - 1 %   Basophils Absolute 0.0  0.0 - 0.1 K/uL  BASIC METABOLIC PANEL      Result Value Ref Range   Sodium 134 (*) 137 - 147 mEq/L   Potassium 4.4  3.7 - 5.3 mEq/L   Chloride 95 (*) 96 - 112 mEq/L   CO2 30  19 - 32 mEq/L   Glucose, Bld  103 (*) 70 - 99 mg/dL   BUN 15  6 - 23 mg/dL   Creatinine, Ser 6.96  0.50 - 1.10 mg/dL   Calcium 9.3  8.4 - 29.5 mg/dL   GFR calc non Af Amer 67 (*) >90 mL/min   GFR calc Af Amer 77 (*) >90 mL/min  TROPONIN I      Result Value Ref Range   Troponin I <0.30  <0.30 ng/mL  PRO B NATRIURETIC PEPTIDE      Result Value Ref Range   Pro B Natriuretic peptide (BNP) 1299.0 (*) 0 - 450 pg/mL    Laboratory interpretation all normal except leukocytosis, elevation of BNP of ? Significance. Patient reports 3 days of constant chest pain with negative troponin. I doubt cardiac etiology of her chest pain.    Imaging Review Dg Chest 2 View  03/26/2014   CLINICAL DATA:  Shortness of breath.  Cough.  EXAM: CHEST  2 VIEW  COMPARISON:  10/25/2013  FINDINGS: Changes of COPD are again seen. Bibasilar chronic interstitial lung disease again demonstrated with associated scarring. Mild cardiomegaly is stable as well as ectasia thoracic aorta. No evidence of pleural effusion. Prior CABG again noted.  IMPRESSION: Stable cardiomegaly and COPD.  No acute findings.   Electronically Signed   By: Myles Rosenthal M.D.   On: 03/26/2014 15:06     EKG Interpretation None       Date: 03/26/2014  Rate: 65  Rhythm: normal sinus rhythm  QRS Axis: normal  Intervals: normal  ST/T Wave abnormalities: nonspecific ST/T changes  Conduction Disutrbances:none  Narrative Interpretation:   Old EKG Reviewed: unchanged from Nov 2014    MDM   Final diagnoses:  Bronchitis    New Prescriptions   AMOXICILLIN (AMOXIL)  250 MG/5ML SUSPENSION    Place 10 mLs (500 mg total) into feeding tube 3 (three) times daily.    Plan discharge   Devoria Albe, MD, FACEP   I personally performed the services described in this documentation, which was scribed in my presence. The recorded information has been reviewed and considered. Devoria Albe, MD, Armando Gang     Ward Givens, MD 03/26/14 [phone]

## 2014-03-26 NOTE — ED Notes (Signed)
Sob over last 3 days. Wears home O2 if needed. Daughter ? If aspirated from feeding tube and has pneumonia. Speaks sentences nad noted in triage.

## 2014-03-26 NOTE — Discharge Instructions (Signed)
Use the amoxil liquid 3 times a day for 10 days. Recheck if you seem worse, such as fever, more short of breath or get chest pain.

## 2014-09-15 ENCOUNTER — Other Ambulatory Visit: Payer: Self-pay | Admitting: *Deleted

## 2014-09-15 ENCOUNTER — Encounter: Payer: Self-pay | Admitting: Vascular Surgery

## 2014-09-15 DIAGNOSIS — L97319 Non-pressure chronic ulcer of right ankle with unspecified severity: Secondary | ICD-10-CM

## 2014-09-16 ENCOUNTER — Encounter: Payer: Self-pay | Admitting: Vascular Surgery

## 2014-09-17 ENCOUNTER — Encounter: Payer: Self-pay | Admitting: Vascular Surgery

## 2014-09-17 ENCOUNTER — Other Ambulatory Visit: Payer: Self-pay | Admitting: Vascular Surgery

## 2014-09-17 ENCOUNTER — Ambulatory Visit (HOSPITAL_COMMUNITY)
Admission: RE | Admit: 2014-09-17 | Discharge: 2014-09-17 | Disposition: A | Discharge status: Home / Self Care | Payer: Medicare Other | Source: Ambulatory Visit | Attending: Vascular Surgery | Admitting: Vascular Surgery

## 2014-09-17 ENCOUNTER — Ambulatory Visit (INDEPENDENT_AMBULATORY_CARE_PROVIDER_SITE_OTHER): Payer: Medicare Other | Admitting: Vascular Surgery

## 2014-09-17 ENCOUNTER — Other Ambulatory Visit: Payer: Self-pay

## 2014-09-17 VITALS — BP 141/72 | HR 58 | Ht 64.0 in | Wt 115.0 lb

## 2014-09-17 DIAGNOSIS — I739 Peripheral vascular disease, unspecified: Secondary | ICD-10-CM | POA: Insufficient documentation

## 2014-09-17 DIAGNOSIS — L97319 Non-pressure chronic ulcer of right ankle with unspecified severity: Secondary | ICD-10-CM

## 2014-09-17 DIAGNOSIS — L98499 Non-pressure chronic ulcer of skin of other sites with unspecified severity: Secondary | ICD-10-CM

## 2014-09-17 DIAGNOSIS — I70209 Unspecified atherosclerosis of native arteries of extremities, unspecified extremity: Secondary | ICD-10-CM | POA: Insufficient documentation

## 2014-09-17 MED ORDER — OXYCODONE-ACETAMINOPHEN 10-325 MG PO TABS: 1.0000 | ORAL_TABLET | ORAL | Status: DC | PRN

## 2014-09-17 NOTE — Progress Notes (Addendum)
Referred by:  Ignatius Speckinghruv B. Vyas, MD [address] [place], [place] [id number]  Reason for referral: non healing wound of right lateral ankle  History of Present Illness  Colleen Perry is a 80 y.o. ([birth year]) female who presents with chief complaint: non healing wound of right lateral ankle that occurred four weeks ago. She did experience a fall four weeks ago but is unsure of whether that caused the wound. She describes the pain as severe aching of her entire foot and interfering with her daily activities and at night. She uses hydrocodone for pain relief.  She has been going to the wound clinic in [place].  The wound has remained the stable. She denies any pain with her left foot. Her daughter notes that any wounds the patient has takes " a long time to heal. "She is still active and ambulates around with a walker and goes to the store. She lives at home alone.   She has a past medical history of scleroderma with esophageal manifestations requiring a feeding tube. She manages her tube feedings at home, but her daughters say she doesn't feed herself much. She intermittently eats snacks but has been highly encouraged not to eat orally by her gastroenterologist. She has a history of CAD s/p CABG 15 years ago. No prior history of CVA. She is a never smoker.   She has hypertension managed with an ACEI and hyperlipidemia managed with a statin.   Past Medical History  Diagnosis Date  . Hypertension   . Anxiety   . Neuromuscular disorder   . Scleroderma   . Chronic cough   . Oropharyngeal dysphagia   . Recurrent abdominal pain   . CAD (coronary artery disease)   . Myocardial infarction   . DVT (deep venous thrombosis)     Past Surgical History  Procedure Laterality Date  . Cardiac surgery    . Coronary artery bypass graft    . Ankle surgery      40 years ago  . Cholecystectomy      15 years ago  . Abdominal hysterectomy    . Feeding tube placement    . Peg placement N/A 10/30/2013   Procedure: PERCUTANEOUS ENDOSCOPIC GASTROSTOMY (PEG) REPLACEMENT;  Surgeon: Malissa HippoNajeeb U Rehman, MD;  Location: [facility];  Service: Endoscopy;  Laterality: N/A;  245    History   Social History  . Marital Status: Widowed    Spouse Name: N/A    Number of Children: N/A  . Years of Education: N/A   Occupational History  . Not on file.   Social History Main Topics  . Smoking status: Never Smoker   . Smokeless tobacco: Never Used  . Alcohol Use: No  . Drug Use: No  . Sexual Activity: Not on file   Other Topics Concern  . Not on file   Social History Narrative  . No narrative on file    Family History  Problem Relation Age of Onset  . Stroke Mother   . Peripheral vascular disease Mother     amputation  . Heart disease Father   . Heart attack Father   . Heart attack Sister   . Stroke Brother     Current Outpatient Prescriptions on File Prior to Visit  Medication Sig Dispense Refill  . ALPRAZolam (XANAX) 1 MG tablet Take 0.5-1 mg by mouth 4 (four) times daily as needed. For anxiety      . amoxicillin (AMOXIL) 250 MG/5ML suspension Place 10 mLs (500  mg total) into feeding tube 3 (three) times daily.  300 mL  0  . benazepril (LOTENSIN) 40 MG tablet Take 40 mg by mouth every morning.      . bisacodyl (DULCOLAX) 5 MG EC tablet Take 5 mg by mouth at bedtime. For constipation      . furosemide (LASIX) 40 MG tablet Take 40-80 mg by mouth every morning.       Marland Kitchen. Hydrocodone-Acetaminophen (VICODIN) 5-300 MG TABS 1 tablet by PEG Tube route 4 (four) times daily as needed.  40 each  0  . Menthol, Topical Analgesic, (BENGAY VANISHING SCENT) 2.5 % GEL Apply 1 application topically 3 (three) times daily as needed (pain).      . naphazoline (CLEAR EYES) 0.012 % ophthalmic solution Place 1 drop into both eyes 4 (four) times daily as needed. For allergies      . nystatin-triamcinolone (MYCOLOG II) cream Apply 1 application topically 2 (two) times daily. Applied to skin around the G-tube site  twice daily for 2 weeks and then as needed  30 g  0  . simvastatin (ZOCOR) 20 MG tablet Take 20 mg by mouth every morning.       . traZODone (DESYREL) 100 MG tablet Take 50-150 mg by mouth at bedtime.       No current facility-administered medications on file prior to visit.    No Known Allergies  REVIEW OF SYSTEMS:  (Positives checked otherwise negative)  CARDIOVASCULAR:  [ x] chest pain, [x ] chest pressure, [ ]  palpitations, [ ]  shortness of breath when laying flat, [x ] shortness of breath with exertion,   [ x] pain in feet when walking, [ ]  pain in feet when laying flat, [ ]  history of blood clot in veins (DVT), [ ]  history of phlebitis, [ x] swelling in legs, [ ]  varicose veins  PULMONARY:  [x ] productive cough, [x ] asthma, [ ]  wheezing  NEUROLOGIC:  [ ]  weakness in arms or legs, [ ]  numbness in arms or legs, [ ]  difficulty speaking or slurred speech, [ ]  temporary loss of vision in one eye, [x ] dizziness  HEMATOLOGIC:  [ ]  bleeding problems, [ ]  problems with blood clotting too easily  MUSCULOSKEL:  [ ]  joint pain, [ ]  joint swelling  GASTROINTEST:  [ ]   Vomiting blood, [ ]   Blood in stool     GENITOURINARY:  [ ]   Burning with urination, [ ]   Blood in urine  PSYCHIATRIC:  [ ]  history of major depression  INTEGUMENTARY:  [ ]  rashes, [ ]  ulcers  CONSTITUTIONAL:  [ ]  fever, [ ]  chills  For VQI Use Only   PRE-ADM LIVING: Home  AMB STATUS: Ambulatory with Assistance  CAD Sx: History of MI, but no symptoms No MI within 6 months  PRIOR CHF: None  STRESS TEST: [ ]  No, [ ]  Normal, [ ]  + ischemia, [ ]  + MI, [ ]  Both  Physical Examination Filed Vitals:   09/17/14 1127  BP: 141/72  Pulse: 58  Height: 5\' 4"  (1.626 m)  Weight: 115 lb (52.164 kg)  SpO2: 100%   Body mass index is 19.73 kg/(m^2).  General: A&O x 3, elderly female in NAD. She is very hard of hearing.   Head: [place]/AT  Neck: Supple, no nuchal rigidity  Pulmonary: Sym exp, good air movt, CTAB, no  rales, rhonchi, & wheezing  Cardiac: RRR, Nl S1, S2, no Murmurs, rubs or gallops  Vascular: Vessel Right Left  Radial  Palpable Palpable  Carotid Palpable, without bruit Palpable, without bruit  Aorta Not palpable N/A  Femoral Palpable Palpable  Popliteal Not palpable Not palpable  PT Palpable Palpable  DP Palpable Palpable   Gastrointestinal: soft, PEG tube in place  Musculoskeletal: M/S 4/5 right grip strength. 5/5 remaining extremities bilaterally. 1 cm yellow ulcer of right lateral ankle. No cellulitis, drainage or fluctuance.   Neurologic: CN 2-12 grossly intact. Pain and light touch intact in extremities   Psychiatric: Judgment intact, Mood & affect appropriatefor pt's clinical situation  Dermatologic: See M/S exam for extremity exam, no rashes otherwise noted  Non-Invasive Vascular Imaging  ABI (Date: 08/14/2014) from [place]  R: 0.56  L: 0.79  Medical Decision Making  Colleen Perry is a 80 y.o. female who presents with non-healing wound of right lateral ankle causing significant pain. This interferes with her daily life and limits her ambulation. Given that she is still active and ambulatory, it is worthwhile to continue forward with an arteriogram and possible intervention for revascularization. The patient and family has expressed that she would not proceed with any surgical interventions. She will be scheduled for an aortogram with bilateral runoff with possible intervention on 10/02/2014 with Dr. Darrick PennaFields . The procedure, risks and benefits were discussed with the patient and family. She will continue with local wound care and pain control.   Maris BergerKimberly Ticara Waner, PA-C [facility] [place] Office: [phone] Pager: [phone]  09/17/2014, 12:12 PM  History and exam details as above. She is able to walk some. She has multiple significant medical problems including pulmonary cardiac and GI dysfunction. Arterial duplex exam today shows a  chronic right superficial femoral artery occlusion with some tibial disease bilaterally. She has nonhealing wound and significant pain in the right foot. She has not really a candidate for open surgical revascularization. However I do believe it might be worthwhile to see if there is a percutaneous option. He is scheduled for aortogram bilateral extremity runoff possible intervention for 10/02/2014. Risks benefits possible complications and procedure details were signed the patient and her family today. She understands and agrees to proceed. We will also need to check her renal function preprocedure to make sure that her renal function is reasonable. If her creatinine is elevated we would most likely cancel the procedure and continue with just medical management.  The patient was requesting stronger pain medication today. Her hydrocodone was discontinued. She was given a prescription today for Percocet 10/325 #50 dispensed.  Fabienne Brunsharles Fields, MD [facility] [place] Office: [phone] Pager: [phone]

## 2014-09-23 ENCOUNTER — Encounter (HOSPITAL_COMMUNITY): Payer: Self-pay | Admitting: Pharmacy Technician

## 2014-09-24 ENCOUNTER — Encounter: Payer: Self-pay | Admitting: Surgery

## 2014-10-02 ENCOUNTER — Encounter (HOSPITAL_COMMUNITY)
Admission: RE | Disposition: A | Discharge status: Home / Self Care | Payer: Self-pay | Source: Ambulatory Visit | Attending: Vascular Surgery

## 2014-10-02 ENCOUNTER — Ambulatory Visit (HOSPITAL_COMMUNITY)
Admission: RE | Admit: 2014-10-02 | Discharge: 2014-10-02 | Disposition: A | Discharge status: Home / Self Care | Payer: Medicare Other | Source: Ambulatory Visit | Attending: Vascular Surgery | Admitting: Vascular Surgery

## 2014-10-02 DIAGNOSIS — I70235 Atherosclerosis of native arteries of right leg with ulceration of other part of foot: Secondary | ICD-10-CM

## 2014-10-02 DIAGNOSIS — E785 Hyperlipidemia, unspecified: Secondary | ICD-10-CM | POA: Insufficient documentation

## 2014-10-02 DIAGNOSIS — Z79891 Long term (current) use of opiate analgesic: Secondary | ICD-10-CM | POA: Insufficient documentation

## 2014-10-02 DIAGNOSIS — L97319 Non-pressure chronic ulcer of right ankle with unspecified severity: Secondary | ICD-10-CM | POA: Insufficient documentation

## 2014-10-02 DIAGNOSIS — I10 Essential (primary) hypertension: Secondary | ICD-10-CM | POA: Insufficient documentation

## 2014-10-02 DIAGNOSIS — L97519 Non-pressure chronic ulcer of other part of right foot with unspecified severity: Secondary | ICD-10-CM | POA: Diagnosis present

## 2014-10-02 DIAGNOSIS — Z79899 Other long term (current) drug therapy: Secondary | ICD-10-CM | POA: Insufficient documentation

## 2014-10-02 HISTORY — PX: ABDOMINAL AORTAGRAM: SHX5454

## 2014-10-02 LAB — POCT I-STAT, CHEM 8
BUN: 8 mg/dL (ref 6–23)
Calcium, Ion: 1.25 mmol/L (ref 1.13–1.30)
Chloride: 97 mEq/L (ref 96–112)
Creatinine, Ser: 0.9 mg/dL (ref 0.50–1.10)
Glucose, Bld: 88 mg/dL (ref 70–99)
HEMATOCRIT: 39 % (ref 36.0–46.0)
Hemoglobin: 13.3 g/dL (ref 12.0–15.0)
POTASSIUM: 3.7 meq/L (ref 3.7–5.3)
Sodium: 136 mEq/L — ABNORMAL LOW (ref 137–147)
TCO2: 28 mmol/L (ref 0–100)

## 2014-10-02 SURGERY — ABDOMINAL AORTAGRAM
Anesthesia: LOCAL

## 2014-10-02 MED ORDER — ACETAMINOPHEN 325 MG RE SUPP: 325.0000 mg | RECTAL | Status: DC | PRN

## 2014-10-02 MED ORDER — LABETALOL HCL 5 MG/ML IV SOLN: 10.0000 mg | INTRAVENOUS | Status: DC | PRN

## 2014-10-02 MED ORDER — GUAIFENESIN-DM 100-10 MG/5ML PO SYRP: 15.0000 mL | ORAL_SOLUTION | ORAL | Status: DC | PRN

## 2014-10-02 MED ORDER — LIDOCAINE HCL (PF) 1 % IJ SOLN
INTRAMUSCULAR | Status: AC
  Filled 2014-10-02: qty 30

## 2014-10-02 MED ORDER — ONDANSETRON HCL 4 MG/2ML IJ SOLN: 4.0000 mg | Freq: Four times a day (QID) | INTRAMUSCULAR | Status: DC | PRN

## 2014-10-02 MED ORDER — HEPARIN (PORCINE) IN NACL 2-0.9 UNIT/ML-% IJ SOLN
INTRAMUSCULAR | Status: AC
  Filled 2014-10-02: qty 1000

## 2014-10-02 MED ORDER — METOPROLOL TARTRATE 1 MG/ML IV SOLN: 2.0000 mg | INTRAVENOUS | Status: DC | PRN

## 2014-10-02 MED ORDER — MORPHINE SULFATE 10 MG/ML IJ SOLN
2.0000 mg | INTRAMUSCULAR | Status: DC | PRN
  Administered 2014-10-02: 2 mg via INTRAVENOUS

## 2014-10-02 MED ORDER — SODIUM CHLORIDE 0.9 % IV SOLN
INTRAVENOUS | Status: DC
Start: 2014-10-02 — End: 2014-10-02
  Administered 2014-10-02: 07:00:00 via INTRAVENOUS

## 2014-10-02 MED ORDER — SODIUM CHLORIDE 0.45 % IV SOLN
INTRAVENOUS | Status: DC
  Administered 2014-10-02: 11:00:00 via INTRAVENOUS

## 2014-10-02 MED ORDER — DOCUSATE SODIUM 100 MG PO CAPS: 100.0000 mg | ORAL_CAPSULE | Freq: Every day | ORAL | Status: DC

## 2014-10-02 MED ORDER — MORPHINE SULFATE 2 MG/ML IJ SOLN
INTRAMUSCULAR | Status: AC
  Administered 2014-10-02: 2 mg via INTRAVENOUS
  Filled 2014-10-02: qty 1

## 2014-10-02 MED ORDER — HYDRALAZINE HCL 20 MG/ML IJ SOLN
5.0000 mg | INTRAMUSCULAR | Status: DC | PRN
  Administered 2014-10-02: 10 mg via INTRAVENOUS

## 2014-10-02 MED ORDER — PHENOL 1.4 % MT LIQD: 1.0000 | OROMUCOSAL | Status: DC | PRN

## 2014-10-02 MED ORDER — TRAMADOL HCL 50 MG PO TABS: 50.0000 mg | ORAL_TABLET | Freq: Four times a day (QID) | ORAL | Status: DC | PRN

## 2014-10-02 MED ORDER — ACETAMINOPHEN 325 MG PO TABS
325.0000 mg | ORAL_TABLET | ORAL | Status: DC | PRN
Start: 2014-10-02 — End: 2014-10-02

## 2014-10-02 MED ORDER — HYDRALAZINE HCL 20 MG/ML IJ SOLN
INTRAMUSCULAR | Status: AC
  Filled 2014-10-02: qty 1

## 2014-10-02 MED ORDER — MORPHINE SULFATE 2 MG/ML IJ SOLN
INTRAMUSCULAR | Status: AC
  Filled 2014-10-02: qty 1

## 2014-10-02 NOTE — H&P (View-Only) (Signed)
Referred by:  Ignatius Speckinghruv B. Vyas, MD [address] [place], [place] [id number]  Reason for referral: non healing wound of right lateral ankle  History of Present Illness  Colleen Perry is a 80 y.o. ([birth year]) female who presents with chief complaint: non healing wound of right lateral ankle that occurred four weeks ago. She did experience a fall four weeks ago but is unsure of whether that caused the wound. She describes the pain as severe aching of her entire foot and interfering with her daily activities and at night. She uses hydrocodone for pain relief.  She has been going to the wound clinic in [place].  The wound has remained the stable. She denies any pain with her left foot. Her daughter notes that any wounds the patient has takes " a long time to heal. "She is still active and ambulates around with a walker and goes to the store. She lives at home alone.   She has a past medical history of scleroderma with esophageal manifestations requiring a feeding tube. She manages her tube feedings at home, but her daughters say she doesn't feed herself much. She intermittently eats snacks but has been highly encouraged not to eat orally by her gastroenterologist. She has a history of CAD s/p CABG 15 years ago. No prior history of CVA. She is a never smoker.   She has hypertension managed with an ACEI and hyperlipidemia managed with a statin.   Past Medical History  Diagnosis Date  . Hypertension   . Anxiety   . Neuromuscular disorder   . Scleroderma   . Chronic cough   . Oropharyngeal dysphagia   . Recurrent abdominal pain   . CAD (coronary artery disease)   . Myocardial infarction   . DVT (deep venous thrombosis)     Past Surgical History  Procedure Laterality Date  . Cardiac surgery    . Coronary artery bypass graft    . Ankle surgery      40 years ago  . Cholecystectomy      15 years ago  . Abdominal hysterectomy    . Feeding tube placement    . Peg placement N/A 10/30/2013   Procedure: PERCUTANEOUS ENDOSCOPIC GASTROSTOMY (PEG) REPLACEMENT;  Surgeon: Malissa HippoNajeeb U Rehman, MD;  Location: [facility];  Service: Endoscopy;  Laterality: N/A;  245    History   Social History  . Marital Status: Widowed    Spouse Name: N/A    Number of Children: N/A  . Years of Education: N/A   Occupational History  . Not on file.   Social History Main Topics  . Smoking status: Never Smoker   . Smokeless tobacco: Never Used  . Alcohol Use: No  . Drug Use: No  . Sexual Activity: Not on file   Other Topics Concern  . Not on file   Social History Narrative  . No narrative on file    Family History  Problem Relation Age of Onset  . Stroke Mother   . Peripheral vascular disease Mother     amputation  . Heart disease Father   . Heart attack Father   . Heart attack Sister   . Stroke Brother     Current Outpatient Prescriptions on File Prior to Visit  Medication Sig Dispense Refill  . ALPRAZolam (XANAX) 1 MG tablet Take 0.5-1 mg by mouth 4 (four) times daily as needed. For anxiety      . amoxicillin (AMOXIL) 250 MG/5ML suspension Place 10 mLs (500  mg total) into feeding tube 3 (three) times daily.  300 mL  0  . benazepril (LOTENSIN) 40 MG tablet Take 40 mg by mouth every morning.      . bisacodyl (DULCOLAX) 5 MG EC tablet Take 5 mg by mouth at bedtime. For constipation      . furosemide (LASIX) 40 MG tablet Take 40-80 mg by mouth every morning.       Marland Kitchen. Hydrocodone-Acetaminophen (VICODIN) 5-300 MG TABS 1 tablet by PEG Tube route 4 (four) times daily as needed.  40 each  0  . Menthol, Topical Analgesic, (BENGAY VANISHING SCENT) 2.5 % GEL Apply 1 application topically 3 (three) times daily as needed (pain).      . naphazoline (CLEAR EYES) 0.012 % ophthalmic solution Place 1 drop into both eyes 4 (four) times daily as needed. For allergies      . nystatin-triamcinolone (MYCOLOG II) cream Apply 1 application topically 2 (two) times daily. Applied to skin around the G-tube site  twice daily for 2 weeks and then as needed  30 g  0  . simvastatin (ZOCOR) 20 MG tablet Take 20 mg by mouth every morning.       . traZODone (DESYREL) 100 MG tablet Take 50-150 mg by mouth at bedtime.       No current facility-administered medications on file prior to visit.    No Known Allergies  REVIEW OF SYSTEMS:  (Positives checked otherwise negative)  CARDIOVASCULAR:  [ x] chest pain, [x ] chest pressure, [ ]  palpitations, [ ]  shortness of breath when laying flat, [x ] shortness of breath with exertion,   [ x] pain in feet when walking, [ ]  pain in feet when laying flat, [ ]  history of blood clot in veins (DVT), [ ]  history of phlebitis, [ x] swelling in legs, [ ]  varicose veins  PULMONARY:  [x ] productive cough, [x ] asthma, [ ]  wheezing  NEUROLOGIC:  [ ]  weakness in arms or legs, [ ]  numbness in arms or legs, [ ]  difficulty speaking or slurred speech, [ ]  temporary loss of vision in one eye, [x ] dizziness  HEMATOLOGIC:  [ ]  bleeding problems, [ ]  problems with blood clotting too easily  MUSCULOSKEL:  [ ]  joint pain, [ ]  joint swelling  GASTROINTEST:  [ ]   Vomiting blood, [ ]   Blood in stool     GENITOURINARY:  [ ]   Burning with urination, [ ]   Blood in urine  PSYCHIATRIC:  [ ]  history of major depression  INTEGUMENTARY:  [ ]  rashes, [ ]  ulcers  CONSTITUTIONAL:  [ ]  fever, [ ]  chills  For VQI Use Only   PRE-ADM LIVING: Home  AMB STATUS: Ambulatory with Assistance  CAD Sx: History of MI, but no symptoms No MI within 6 months  PRIOR CHF: None  STRESS TEST: [ ]  No, [ ]  Normal, [ ]  + ischemia, [ ]  + MI, [ ]  Both  Physical Examination Filed Vitals:   09/17/14 1127  BP: 141/72  Pulse: 58  Height: 5\' 4"  (1.626 m)  Weight: 115 lb (52.164 kg)  SpO2: 100%   Body mass index is 19.73 kg/(m^2).  General: A&O x 3, elderly female in NAD. She is very hard of hearing.   Head: [place]/AT  Neck: Supple, no nuchal rigidity  Pulmonary: Sym exp, good air movt, CTAB, no  rales, rhonchi, & wheezing  Cardiac: RRR, Nl S1, S2, no Murmurs, rubs or gallops  Vascular: Vessel Right Left  Radial  Palpable Palpable  Carotid Palpable, without bruit Palpable, without bruit  Aorta Not palpable N/A  Femoral Palpable Palpable  Popliteal Not palpable Not palpable  PT Palpable Palpable  DP Palpable Palpable   Gastrointestinal: soft, PEG tube in place  Musculoskeletal: M/S 4/5 right grip strength. 5/5 remaining extremities bilaterally. 1 cm yellow ulcer of right lateral ankle. No cellulitis, drainage or fluctuance.   Neurologic: CN 2-12 grossly intact. Pain and light touch intact in extremities   Psychiatric: Judgment intact, Mood & affect appropriatefor pt's clinical situation  Dermatologic: See M/S exam for extremity exam, no rashes otherwise noted  Non-Invasive Vascular Imaging  ABI (Date: 08/14/2014) from [place]  R: 0.56  L: 0.79  Medical Decision Making  Colleen ModestRosemary H Shuart is a 80 y.o. female who presents with non-healing wound of right lateral ankle causing significant pain. This interferes with her daily life and limits her ambulation. Given that she is still active and ambulatory, it is worthwhile to continue forward with an arteriogram and possible intervention for revascularization. The patient and family has expressed that she would not proceed with any surgical interventions. She will be scheduled for an aortogram with bilateral runoff with possible intervention on 10/02/2014 with Dr. Darrick PennaFields . The procedure, risks and benefits were discussed with the patient and family. She will continue with local wound care and pain control.   Maris BergerKimberly Trinh, PA-C [facility] Office: [phone] Pager: [phone]  09/17/2014, 12:12 PM  History and exam details as above. She is able to walk some. She has multiple significant medical problems including pulmonary cardiac and GI dysfunction. Arterial duplex exam today shows a  chronic right superficial femoral artery occlusion with some tibial disease bilaterally. She has nonhealing wound and significant pain in the right foot. She has not really a candidate for open surgical revascularization. However I do believe it might be worthwhile to see if there is a percutaneous option. He is scheduled for aortogram bilateral extremity runoff possible intervention for 10/02/2014. Risks benefits possible complications and procedure details were signed the patient and her family today. She understands and agrees to proceed. We will also need to check her renal function preprocedure to make sure that her renal function is reasonable. If her creatinine is elevated we would most likely cancel the procedure and continue with just medical management.  The patient was requesting stronger pain medication today. Her hydrocodone was discontinued. She was given a prescription today for Percocet 10/325 #50 dispensed.  Fabienne Brunsharles Taeler Winning, MD [facility] Office: [phone] Pager: [phone]

## 2014-10-02 NOTE — Op Note (Signed)
Procedure: Aortogram with bilateral lower extremity runoff  Preoperative diagnosis: Nonhealing wound right foot Postoperative diagnosis: Same  Anesthesia: Local  Operative details: After obtaining informed consent, patient was taken to the PV lab. The patient's by supine position Angio table. Both groins were prepped and draped in usual sterile fashion. Local anesthesia was infiltrated over the left common femoral artery. Ultrasound was used to identify the left common femoral artery. An introducer needle was then used to cannulate the left common femoral artery without difficulty. An 035 versacore wire was then threaded up into the abdominal aorta without fluoroscopic guidance.a 5 French sheath placed over the guidewire and the left common femoral artery. This was thoroughly flushed with heparinized saline. A 5 French pigtail catheter was then placed over the guidewire and the abdominal aorta and abdominal aortogram obtained. Right renal artery is patent. Left renal artery is occluded. The infrarenal abdominal aorta is patent. The left and right common external and internal iliac arteries are patent. Next, the pigtail catheter was then pulled down just above the aortic bifurcation and oblique views of the pelvis were obtained confirming the above findings. Also there is a high-grade stenosis in the right common femoral artery with exophytic calcified atherosclerotic plaque. This obstructs the lumen 75-80%.next bilateral lower extremity runoff views were obtained through the Catheter.  In the left lower extremity, the left common femoral artery is patent. The left superficial femoral artery is effusive diseased with multiple areas of 50-70% stenosis. These are also calcified. The profunda femoris artery is patent. There is one-vessel runoff to the left foot via the peroneal artery. Anterior tibial and posterior tibial arteries are occluded.  In the right lower extremity, the right common femoral artery  again has an 80% stenosis from exophytic calcified plaque. The right superficial femoral artery is patent in its proximal portion but occludes in the mid leg. The below-knee popliteal artery then reconstitutes via profunda collaterals. The profunda is widely patent. The anterior tibial and posterior tibial arteries are occluded in the right leg. The peroneal artery is patent proximally but occludes in the mid leg and there are only collaterals filling the right foot.  At this point the catheter was removed over a guidewire and the 5 French sheath was thoroughly flushed with heparinized saline. The patient tolerated procedure well and there were no complications. The patient was taken to the holding area in stable condition.  Operative management: The patient will be offered a right common femoral endarterectomy. I do not believe that she would tolerate a right femoropopliteal bypass at this point is a she is overall very deconditioned. However without improved flow through her profunda system she may not be able to heal the wound on her right leg.  Fabienne Brunsharles Fields, MD [facility] [place] Office: [phone] Pager: [phone]

## 2014-10-02 NOTE — Interval H&P Note (Signed)
History and Physical Interval Note:  10/02/2014 7:44 AM  Colleen Perry  has presented today for surgery, with the diagnosis of non-healing wound to right ankle  The various methods of treatment have been discussed with the patient and family. After consideration of risks, benefits and other options for treatment, the patient has consented to  Procedure(s): ABDOMINAL AORTAGRAM (N/A) as a surgical intervention .  The patient's history has been reviewed, patient examined, no change in status, stable for surgery.  I have reviewed the patient's chart and labs.  Questions were answered to the patient's satisfaction.     FIELDS,CHARLES E

## 2014-10-02 NOTE — CV Procedure (Addendum)
Lt fem art sheath removed. Pressure held for manually. Pressure dressing applied and pt instructed. No hematoma, problems or complications. Lt dp/pt 1+.  Also doppler heard on lt dp/pt pulses.

## 2014-10-02 NOTE — Discharge Instructions (Signed)

## 2014-10-05 ENCOUNTER — Other Ambulatory Visit: Payer: Self-pay | Admitting: *Deleted

## 2014-10-05 DIAGNOSIS — I739 Peripheral vascular disease, unspecified: Secondary | ICD-10-CM

## 2014-10-05 DIAGNOSIS — Z01810 Encounter for preprocedural cardiovascular examination: Secondary | ICD-10-CM

## 2014-10-06 ENCOUNTER — Telehealth: Payer: Self-pay | Admitting: Vascular Surgery

## 2014-10-06 NOTE — Telephone Encounter (Addendum)
-----   Message from Lear NgStephanie Nicole Dehart, RN sent at 10/02/2014  1:18 PM EST ----- Will you please schedule this patient for a cardiac evaluation and f/u with Dr. Darrick PennaFields? Thank you!  Judeth CornfieldStephanie   ----- Message -----    From: Sherren Kernsharles E Fields, MD    Sent: 10/02/2014  10:25 AM      To: Gayleen OremStephanie Nicole Dehart, RN  Pt needs cardiac eval preop.  She needs appt with me in 3-4 weeks to recheck wound and decide on femoral endarterectomy  Charles   10/06/14: left msg for patients daughter Skipper Clicheana to call for info- Cards scheduled 10/08/14 @ 2 and [facility] 10/15/14 @ 3:45, dpm

## 2014-10-08 ENCOUNTER — Encounter: Payer: Self-pay | Admitting: Cardiology

## 2014-10-08 ENCOUNTER — Encounter: Payer: Medicare Other | Admitting: Cardiology

## 2014-10-08 NOTE — Progress Notes (Signed)
ERROR No Show  

## 2014-10-08 NOTE — Telephone Encounter (Signed)
Colleen CornfieldStephanie- it does not look as though Colleen Perry went to her cardiology appointment that was set up today. I am not sure Colleen Perry understands the reasoning for these appointments. I tried to explain it to the best of my ability, but she was very hesitant and resistant to moving forward. Would you mind to call her?

## 2014-10-09 ENCOUNTER — Telehealth: Payer: Self-pay

## 2014-10-09 NOTE — Telephone Encounter (Signed)
Called Skipper Clicheana (daughter) at patient's request to discuss status of patient's cardiac appointment. Skipper Clicheana states that she knew nothing about this appointment yesterday, 10/08/14, with Dr. Wyline MoodBranch and thinks it is best that we not reschedule, due to her mother being "really sick." Also, Skipper Clicheana states that she would like to cancel the patient's appointment with Dr. Darrick PennaFields that is currently scheduled for next Thursday, 10/15/14. Patient's daughter states that she will have a conversation with her mother and will decide if they want to proceed with surgery. Instructed patient's daughter to notify the office with any questions, concerns or if deciding to schedule an appointment for follow-up. Daughter verbalized understanding.

## 2014-10-09 NOTE — Telephone Encounter (Signed)
Called patient to check on status of cardiac appointment that was scheduled for 10/08/14 with Dr. Wyline MoodBranch. Patient states that she is having a hard time hearing me and is requesting me to call her daughter, Skipper Clicheana to discuss my questions. Patient provided daughter's phone number as [phone].

## 2014-10-15 ENCOUNTER — Ambulatory Visit: Payer: Medicare Other | Admitting: Vascular Surgery

## 2014-10-19 ENCOUNTER — Telehealth (INDEPENDENT_AMBULATORY_CARE_PROVIDER_SITE_OTHER): Payer: Self-pay | Admitting: *Deleted

## 2014-10-19 NOTE — Telephone Encounter (Addendum)
Clydie BraunKaren said Rayshell's feeding tube came on out on 10/18/14 and the daughter put in a foley cathter until it could be replaced. Her return phone number is [phone]. May also call Sri Lankaana at [phone].

## 2014-10-21 ENCOUNTER — Ambulatory Visit (HOSPITAL_COMMUNITY)
Admission: RE | Admit: 2014-10-21 | Discharge: 2014-10-21 | Disposition: A | Discharge status: Home / Self Care | Payer: Medicare Other | Source: Ambulatory Visit | Attending: Internal Medicine | Admitting: Internal Medicine

## 2014-10-21 ENCOUNTER — Ambulatory Visit (HOSPITAL_COMMUNITY): Payer: Medicare Other

## 2014-10-21 ENCOUNTER — Encounter (HOSPITAL_COMMUNITY): Payer: Self-pay | Admitting: *Deleted

## 2014-10-21 ENCOUNTER — Encounter (HOSPITAL_COMMUNITY)
Admission: RE | Disposition: A | Discharge status: Home / Self Care | Payer: Self-pay | Source: Ambulatory Visit | Attending: Internal Medicine

## 2014-10-21 ENCOUNTER — Other Ambulatory Visit (INDEPENDENT_AMBULATORY_CARE_PROVIDER_SITE_OTHER): Payer: Self-pay | Admitting: *Deleted

## 2014-10-21 DIAGNOSIS — K9423 Gastrostomy malfunction: Secondary | ICD-10-CM

## 2014-10-21 DIAGNOSIS — R1313 Dysphagia, pharyngeal phase: Secondary | ICD-10-CM | POA: Insufficient documentation

## 2014-10-21 DIAGNOSIS — I2510 Atherosclerotic heart disease of native coronary artery without angina pectoris: Secondary | ICD-10-CM | POA: Diagnosis not present

## 2014-10-21 DIAGNOSIS — F419 Anxiety disorder, unspecified: Secondary | ICD-10-CM | POA: Diagnosis not present

## 2014-10-21 DIAGNOSIS — Z86718 Personal history of other venous thrombosis and embolism: Secondary | ICD-10-CM | POA: Diagnosis not present

## 2014-10-21 DIAGNOSIS — Z951 Presence of aortocoronary bypass graft: Secondary | ICD-10-CM | POA: Insufficient documentation

## 2014-10-21 DIAGNOSIS — Z79899 Other long term (current) drug therapy: Secondary | ICD-10-CM | POA: Diagnosis not present

## 2014-10-21 DIAGNOSIS — I10 Essential (primary) hypertension: Secondary | ICD-10-CM | POA: Diagnosis not present

## 2014-10-21 DIAGNOSIS — R627 Adult failure to thrive: Secondary | ICD-10-CM

## 2014-10-21 DIAGNOSIS — Z09 Encounter for follow-up examination after completed treatment for conditions other than malignant neoplasm: Secondary | ICD-10-CM

## 2014-10-21 HISTORY — PX: PEG PLACEMENT: SHX5437

## 2014-10-21 SURGERY — REPLACEMENT, PEG TUBE, WITHOUT ENDOSCOPY
Anesthesia: LOCAL

## 2014-10-21 NOTE — Op Note (Signed)
Gastrostomy tube placement.  Indication; Patient's gastrostomy tube came out yesterday and her daughter who is an Charity fundraiserN advanced Foley's catheter as not to lose access. Patient is here to have new gastrostomy tube placed.  Findings; Peristomal erythema noted. Foley's catheter was removed after deflating the balloon. 20 French balloon replacement gastrostomy tube was advanced through gastric fistula into the stomach. Balloon was filled with 6 mL of sterile water. Plain abdominal film was taken after injecting 30 mL of Omnipaque through the tube. Tube was in in the stomach.  Assessment; Successful placement of balloon replacement gastrostomy tube while existing gastric fistula.  Recommendations;  Resume gastric feeding as before.

## 2014-10-21 NOTE — H&P (Signed)
Colleen Perry is an 80 y.o. female.   Chief Complaint: Malfunctioning gastrostomy tube. HPI: Patient is 80 year old Caucasian female with multiple medical problems who has pharyngeal phase dysphagia with recurrent aspiration and has been fed via gastrostomy tube for past few years. Gastrostomy tube came out. Her daughter who is an Charity fundraiserN advanced Foley's catheter to keep the fistula open. Ration has not been able to use 40s catheter for feeding or for medications.  Past Medical History  Diagnosis Date  . Hypertension   . Anxiety   . Neuromuscular disorder   . Scleroderma   . Chronic cough   . Oropharyngeal dysphagia   . Recurrent abdominal pain   . CAD (coronary artery disease)   . Myocardial infarction   . DVT (deep venous thrombosis)     Past Surgical History  Procedure Laterality Date  . Cardiac surgery    . Coronary artery bypass graft    . Ankle surgery      40 years ago  . Cholecystectomy      15 years ago  . Abdominal hysterectomy    . Feeding tube placement    . Peg placement N/A 10/30/2013    Procedure: PERCUTANEOUS ENDOSCOPIC GASTROSTOMY (PEG) REPLACEMENT;  Surgeon: Malissa HippoNajeeb U Paul Torpey, MD;  Location: [facility];  Service: Endoscopy;  Laterality: N/A;  245    Family History  Problem Relation Age of Onset  . Stroke Mother   . Peripheral vascular disease Mother     amputation  . Heart disease Father   . Heart attack Father   . Heart attack Sister   . Stroke Brother    Social History:  reports that she has never smoked. She has never used smokeless tobacco. She reports that she does not drink alcohol or use illicit drugs.  Allergies: No Known Allergies  Medications Prior to Admission  Medication Sig Dispense Refill  . ALPRAZolam (XANAX) 1 MG tablet Take 0.5-1 mg by mouth 4 (four) times daily as needed. For anxiety    . benazepril (LOTENSIN) 40 MG tablet Take 40 mg by mouth every morning.    . bisacodyl (DULCOLAX) 5 MG EC tablet Take 5 mg by mouth at bedtime.  For constipation    . furosemide (LASIX) 40 MG tablet Take 40-80 mg by mouth every morning. 40 mg every am.   Takes and extra tablet if she has increased edema.    Marland Kitchen. HYDROcodone-acetaminophen (NORCO) 7.5-325 MG per tablet Take 1 tablet by mouth every 6 (six) hours as needed for moderate pain.    . Menthol, Topical Analgesic, (BENGAY VANISHING SCENT) 2.5 % GEL Apply 1 application topically 3 (three) times daily as needed (pain).    . naphazoline (CLEAR EYES) 0.012 % ophthalmic solution Place 1 drop into both eyes 4 (four) times daily as needed. For allergies    . nystatin-triamcinolone (MYCOLOG II) cream Apply 1 application topically 2 (two) times daily. Applied to skin around the G-tube site twice daily for 2 weeks and then as needed 30 g 0  . oxyCODONE-acetaminophen (PERCOCET) 10-325 MG per tablet Take 1 tablet by mouth every 4 (four) hours as needed for pain. 50 tablet 0  . simvastatin (ZOCOR) 20 MG tablet Take 20 mg by mouth every morning.     . traZODone (DESYREL) 100 MG tablet Take 200 mg by mouth at bedtime.       No results found for this or any previous visit (from the past 48 hour(s)). No results found.  ROS  Blood  pressure 133/73, pulse 62, temperature 97.8 F (36.6 C), temperature source Oral, resp. rate 20, SpO2 98 %. Physical Exam  Constitutional:  Well-developed thin Caucasian female in NAD  Neck: No thyromegaly present.  Respiratory: Effort normal and breath sounds normal.  GI:  Abdomen is flat.  Patient has gastric fistula with Foley's catheter in it.  Musculoskeletal: She exhibits no edema.  Lymphadenopathy:    She has no cervical adenopathy.  Neurological: She is alert.  Skin: Skin is warm and dry.     Assessment/Plan Malfunctioning gastrostomy tube. Patient here for replacement of gastrostomy tube.  Nayzeth Altman U 10/21/2014, 5:39 PM

## 2014-10-21 NOTE — Discharge Instructions (Signed)
Resume gastric feeding as before. Can give medications while gastrostomy tube. Please remember to flush tube with water every time medication is given.

## 2014-10-22 NOTE — Telephone Encounter (Signed)
Tube was changed yesterday

## 2014-10-26 ENCOUNTER — Encounter (HOSPITAL_COMMUNITY): Payer: Self-pay | Admitting: Internal Medicine

## 2014-10-29 ENCOUNTER — Ambulatory Visit: Payer: Medicare Other | Admitting: Vascular Surgery

## 2014-11-05 ENCOUNTER — Encounter (HOSPITAL_COMMUNITY): Payer: Self-pay | Admitting: Vascular Surgery

## 2014-12-07 ENCOUNTER — Telehealth (INDEPENDENT_AMBULATORY_CARE_PROVIDER_SITE_OTHER): Payer: Self-pay | Admitting: *Deleted

## 2014-12-07 NOTE — Telephone Encounter (Signed)
Colleen Perry called stating the milk is not staying in Colleen Perry's stomach. Please return her call to [phone] or [phone].

## 2014-12-07 NOTE — Telephone Encounter (Signed)
Caregiver was called at the [phone] number and a message was left for her to call in the morning,more info was needed.

## 2014-12-08 NOTE — Telephone Encounter (Signed)
Caregiver returned phone call. She states that the patient saw Dr.Vyas recently. She has lost from 132 pounds,in last few months, down to 106 pounds. The milk is not staying in her stomach, when they open the cap on tube,milk is flowing out. Also out of her mouth. She states that Dr.Rehman replaced the tube several weeks ago ,and Dr.Vyas checked placement and it was good.  They are questioning if the stomach is rejecting the milk?  Per Dr.Rehman the patient should go to the ED ,as they can do test. Caregiver called and given Dr.Rehman's recommendation. Voice mail was left on answering service.

## 2014-12-10 NOTE — Telephone Encounter (Signed)
Samira's daughter called and ask if they could get an order for x-ray or go straight over there to have her tube checked. Spoke with Tammy, explain to  that Dr. Karilyn Cotaehman said Coy SaunasRosemary need to go to the ED. Read his instructions dated 12/08/14. Voices understood and said they will go tomorrow, 12/11/14.0

## 2015-07-06 ENCOUNTER — Ambulatory Visit (INDEPENDENT_AMBULATORY_CARE_PROVIDER_SITE_OTHER): Payer: Medicare Other | Admitting: Internal Medicine

## 2015-07-06 ENCOUNTER — Encounter (INDEPENDENT_AMBULATORY_CARE_PROVIDER_SITE_OTHER): Payer: Self-pay | Admitting: Internal Medicine

## 2015-07-06 VITALS — BP 118/78 | HR 68 | Temp 98.1°F | Resp 18 | Ht 62.0 in | Wt 106.4 lb

## 2015-07-06 DIAGNOSIS — Z931 Gastrostomy status: Secondary | ICD-10-CM | POA: Diagnosis not present

## 2015-07-06 DIAGNOSIS — R634 Abnormal weight loss: Secondary | ICD-10-CM | POA: Diagnosis not present

## 2015-07-06 DIAGNOSIS — R1314 Dysphagia, pharyngoesophageal phase: Secondary | ICD-10-CM | POA: Diagnosis not present

## 2015-07-06 NOTE — Patient Instructions (Addendum)
Can decrease bolus volume but increase frequency to 5 or 6 times a day. Call if gastrostomy tube starts clogging up

## 2015-07-06 NOTE — Progress Notes (Signed)
Presenting complaint;  Oropharyngeal dysphagia. Patient has gastrostomy tube.  Subjective:  Patient is 81 year old Caucasian female who is here for scheduled visit. She was last seen in the office on 01/27/2013 and more recently she was seen a day hospital for PEG tube change in November 2015. Patient lives alone but has [employer].. She is visited by her niece Harriett Sine and her daughter [name] on regular basis. She is on bolus feeding. She is giving herself 3 cans of feeding per day. She is supposed to be on 4 cans per day. She has backed off because sometimes feeling squirts out of the tube. She is not having regurgitation. She remains with dysphagia to liquids and solids. She complains of chronic back pain. She has lost 27 pounds since March 2014. Her knee since he states that she has not lost any weight this year. She denies melena or rectal bleeding. She takes Dulcolax tablets 2-3 times a week for constipation. She is very concerned about losing her teeth and her recent visit with Dr. Lita Mains she was told that she may lose her vision if she lives long enough.  Current Medications: Outpatient Encounter Prescriptions as of 07/06/2015  Medication Sig  . ALPRAZolam (XANAX) 1 MG tablet Take 0.5-1 mg by mouth 4 (four) times daily as needed. For anxiety  . benazepril (LOTENSIN) 40 MG tablet Take 40 mg by mouth every morning.  . bisacodyl (DULCOLAX) 5 MG EC tablet Take 5 mg by mouth at bedtime. For constipation  . DHA-EPA-Flaxseed Oil-Vitamin E (THERA TEARS NUTRITION PO) Take by mouth.  . DHA-EPA-Flaxseed Oil-Vitamin E (THERA TEARS NUTRITION PO) Take 2 drops by mouth 4 (four) times daily as needed.  Marland Kitchen HYDROcodone-acetaminophen (NORCO) 7.5-325 MG per tablet Take 1 tablet by mouth every 6 (six) hours as needed for moderate pain.  . Menthol, Topical Analgesic, (BENGAY VANISHING SCENT) 2.5 % GEL Apply 1 application topically 3 (three) times daily as needed (pain).  . nystatin-triamcinolone (MYCOLOG II) cream  Apply 1 application topically 2 (two) times daily. Applied to skin around the G-tube site twice daily for 2 weeks and then as needed  . simvastatin (ZOCOR) 20 MG tablet Take 20 mg by mouth every morning.   . traZODone (DESYREL) 100 MG tablet Take 200 mg by mouth at bedtime.   . furosemide (LASIX) 40 MG tablet Take 40-80 mg by mouth every morning. 40 mg every am.   Takes and extra tablet if she has increased edema.  . [DISCONTINUED] naphazoline (CLEAR EYES) 0.012 % ophthalmic solution Place 1 drop into both eyes 4 (four) times daily as needed. For allergies  . [DISCONTINUED] oxyCODONE-acetaminophen (PERCOCET) 10-325 MG per tablet Take 1 tablet by mouth every 4 (four) hours as needed for pain. (Patient not taking: Reported on 07/06/2015)   No facility-administered encounter medications on file as of 07/06/2015.     Objective: Blood pressure 118/78, pulse 68, temperature 98.1 F (36.7 C), temperature source Oral, resp. rate 18, height 5\' 2"  (1.575 m), weight 106 lb 6.4 oz (48.263 kg). Patient is alert and in no acute distress. She has generalized wasting. He has mild hearing impairment. Conjunctiva is pink. Sclera is nonicteric Oropharyngeal mucosa is normal. No neck masses or thyromegaly noted. Cardiac exam with regular rhythm normal S1 and S2. She has faint systolic ejection murmur best heard at aortic area. Lungs are clear to auscultation. Abdomen. Gastrostomy tube is in place. No exudate or graduation tissue noted at G site. Abdomen is soft and nontender. No LE edema or clubbing noted.  Labs/studies Results: No recent lab on file.   Assessment:  #1. Gastrostomy tube is functioning well. Site appears healthy. This tube was changed in November 2015. #2. Weight loss. According to her niece she has not lost any weight in the last 6 months. However she has lost 27 pounds in 3-1/2 years. Weight loss is most likely secondary to diminished intake of calories. She appears malnourished. #3.  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   Plan:  Will check serum prealbumin. Patient encouraged to use all 4 cans of supplement but she can do 6 bolus feedings instead of three. Office visit in 6 months.

## 2015-07-07 LAB — PREALBUMIN: PREALBUMIN: 33 mg/dL (ref 17–34)

## 2015-07-08 ENCOUNTER — Telehealth (INDEPENDENT_AMBULATORY_CARE_PROVIDER_SITE_OTHER): Payer: Self-pay | Admitting: *Deleted

## 2015-07-08 ENCOUNTER — Other Ambulatory Visit (INDEPENDENT_AMBULATORY_CARE_PROVIDER_SITE_OTHER): Payer: Self-pay | Admitting: *Deleted

## 2015-07-08 MED ORDER — POLYETHYLENE GLYCOL 3350 17 GM/SCOOP PO POWD: 1.0000 | Freq: Every day | ORAL | Status: DC

## 2015-07-08 NOTE — Telephone Encounter (Signed)
Per Dr.Rehman the patient may take the Miralax 1 capful daily.

## 2015-07-08 NOTE — Telephone Encounter (Deleted)
Colleen Perry has called back. Dr.Hoss is ordering a CT to be done at the [facility]. [name] was made aware that she should get a call from [facility].

## 2015-07-08 NOTE — Telephone Encounter (Signed)
Colleen Perry has called back and says she is miserable with constipation also

## 2015-07-08 NOTE — Telephone Encounter (Signed)
Daughter called   She had mentioned her constipation at her visit.  Dr. Karilyn Cota had mentioned giving her an RX instead of her using OTC.  She forget to ask him before leaving.  [facility]

## 2015-07-08 NOTE — Telephone Encounter (Signed)
Patient was advised to stop the Colace 200 mg at night. Patient states that her niece came over and brought her prune juice, and administered an enema. She did have some results.

## 2015-07-08 NOTE — Telephone Encounter (Signed)
Per Dr.Rehman - the patient may stop the colace. She may go back to the Miralax every day. Patient was called and made aware.

## 2015-07-17 ENCOUNTER — Emergency Department (HOSPITAL_COMMUNITY): Payer: Medicare Other

## 2015-07-17 ENCOUNTER — Encounter (HOSPITAL_COMMUNITY): Payer: Self-pay | Admitting: Cardiology

## 2015-07-17 ENCOUNTER — Emergency Department (HOSPITAL_COMMUNITY)
Admission: EM | Admit: 2015-07-17 | Discharge: 2015-07-17 | Disposition: A | Discharge status: Home / Self Care | Payer: Medicare Other | Attending: Emergency Medicine | Admitting: Emergency Medicine

## 2015-07-17 DIAGNOSIS — Z86718 Personal history of other venous thrombosis and embolism: Secondary | ICD-10-CM | POA: Insufficient documentation

## 2015-07-17 DIAGNOSIS — I10 Essential (primary) hypertension: Secondary | ICD-10-CM | POA: Insufficient documentation

## 2015-07-17 DIAGNOSIS — R1012 Left upper quadrant pain: Secondary | ICD-10-CM | POA: Diagnosis present

## 2015-07-17 DIAGNOSIS — I252 Old myocardial infarction: Secondary | ICD-10-CM | POA: Diagnosis not present

## 2015-07-17 DIAGNOSIS — Z79899 Other long term (current) drug therapy: Secondary | ICD-10-CM | POA: Diagnosis not present

## 2015-07-17 DIAGNOSIS — K9423 Gastrostomy malfunction: Secondary | ICD-10-CM | POA: Diagnosis not present

## 2015-07-17 DIAGNOSIS — Z8669 Personal history of other diseases of the nervous system and sense organs: Secondary | ICD-10-CM | POA: Insufficient documentation

## 2015-07-17 DIAGNOSIS — Z8739 Personal history of other diseases of the musculoskeletal system and connective tissue: Secondary | ICD-10-CM | POA: Insufficient documentation

## 2015-07-17 DIAGNOSIS — F419 Anxiety disorder, unspecified: Secondary | ICD-10-CM | POA: Diagnosis not present

## 2015-07-17 DIAGNOSIS — I2510 Atherosclerotic heart disease of native coronary artery without angina pectoris: Secondary | ICD-10-CM | POA: Insufficient documentation

## 2015-07-17 DIAGNOSIS — Z431 Encounter for attention to gastrostomy: Secondary | ICD-10-CM

## 2015-07-17 DIAGNOSIS — T85528A Displacement of other gastrointestinal prosthetic devices, implants and grafts, initial encounter: Secondary | ICD-10-CM

## 2015-07-17 LAB — URINALYSIS, ROUTINE W REFLEX MICROSCOPIC
Bilirubin Urine: NEGATIVE
Glucose, UA: NEGATIVE mg/dL
Ketones, ur: NEGATIVE mg/dL
Nitrite: NEGATIVE
PROTEIN: NEGATIVE mg/dL
Specific Gravity, Urine: <1.005 — ABNORMAL LOW (ref 1.005–1.030)
UROBILINOGEN UA: 0.2 mg/dL (ref 0.0–1.0)
pH: 7 (ref 5.0–8.0)

## 2015-07-17 LAB — URINE MICROSCOPIC-ADD ON

## 2015-07-17 NOTE — ED Notes (Signed)
Discharge instructions given, pt demonstrated teach back and verbal understanding. No concerns voiced.  

## 2015-07-17 NOTE — ED Notes (Addendum)
Peg tube came out about 30 min ago.  Pt states the tube has had a bad smell coming from it for about a week.

## 2015-07-17 NOTE — Discharge Instructions (Signed)
Gastric Tube Replacement °You are having your gastric tube (the tube that goes into the stomach) changed. This is usually a very minor procedure. If medications are prescribed, take them as directed. Only take over-the-counter or prescription medications for pain, discomfort, or fever as directed by your caregiver.  °SEEK IMMEDIATE MEDICAL CARE IF:  °· You develop chills, fever, or show signs of generalized illness. °· You develop bleeding around the tube. °· Your new tube does not seem to be working properly. °· You are unable to get feedings into the tube. °· Your tube comes out for any reason. °Document Released: 08/08/2001 Document Revised: 02/05/2012 Document Reviewed: 11/13/2005 °ExitCare® Patient Information ©2015 [employer]. This information is not intended to replace advice given to you by your health care provider. Make sure you discuss any questions you have with your health care provider. ° °

## 2015-07-17 NOTE — ED Provider Notes (Signed)
CSN: [id number]     Arrival date & time 07/17/15  4098 History   First MD Initiated Contact with Patient 07/17/15 1905     No chief complaint on file.    (Consider location/radiation/quality/duration/timing/severity/associated sxs/prior Treatment) The history is provided by the patient.   patient has a PEG tube in. His 20 [place]. Came out this evening. She has minimal oral intake. Otherwise she states it has been smelling funny last couple days. No fevers. She has had to have it replaced in the past and this one has been there for about 9 months.  Past Medical History  Diagnosis Date  . Hypertension   . Anxiety   . Neuromuscular disorder   . Scleroderma   . Chronic cough   . Oropharyngeal dysphagia   . Recurrent abdominal pain   . CAD (coronary artery disease)   . Myocardial infarction   . DVT (deep venous thrombosis)    Past Surgical History  Procedure Laterality Date  . Cardiac surgery    . Coronary artery bypass graft    . Ankle surgery      40 years ago  . Cholecystectomy      15 years ago  . Abdominal hysterectomy    . Feeding tube placement    . Peg placement N/A 10/30/2013    Procedure: PERCUTANEOUS ENDOSCOPIC GASTROSTOMY (PEG) REPLACEMENT;  Surgeon: Malissa Hippo, MD;  Location: [facility];  Service: Endoscopy;  Laterality: N/A;  245  . Peg placement N/A 10/21/2014    Procedure: PERCUTANEOUS ENDOSCOPIC GASTROSTOMY (PEG) REPLACEMENT;  Surgeon: Malissa Hippo, MD;  Location: [facility];  Service: Endoscopy;  Laterality: N/A;  . Abdominal aortagram N/A 10/02/2014    Procedure: ABDOMINAL AORTAGRAM;  Surgeon: Sherren Kerns, MD;  Location: [facility] CATH LAB;  Service: Cardiovascular;  Laterality: N/A;   Family History  Problem Relation Age of Onset  . Stroke Mother   . Peripheral vascular disease Mother     amputation  . Heart disease Father   . Heart attack Father   . Heart attack Sister   . Stroke Brother    Social History  Substance Use Topics  . Smoking  status: Never Smoker   . Smokeless tobacco: Never Used  . Alcohol Use: No   OB History    No data available     Review of Systems  Constitutional: Negative for fever and chills.  Gastrointestinal: Negative for nausea, vomiting and abdominal pain.  Skin: Negative for rash and wound.      Allergies  Review of patient's allergies indicates no known allergies.  Home Medications   Prior to Admission medications   Medication Sig Start Date End Date Taking? Authorizing Provider  ALPRAZolam Prudy Feeler) 1 MG tablet Take 0.5-1 mg by mouth 4 (four) times daily as needed. For anxiety    Historical Provider, MD  benazepril (LOTENSIN) 40 MG tablet Take 40 mg by mouth every morning.    Historical Provider, MD  bisacodyl (DULCOLAX) 5 MG EC tablet Take 5 mg by mouth at bedtime. For constipation    Historical Provider, MD  cilostazol (PLETAL) 100 MG tablet Take 100 mg by mouth daily. 07/10/15   Historical Provider, MD  DHA-EPA-Flaxseed Oil-Vitamin E (THERA TEARS NUTRITION PO) Take by mouth.    Historical Provider, MD  DHA-EPA-Flaxseed Oil-Vitamin E (THERA TEARS NUTRITION PO) Take 2 drops by mouth 4 (four) times daily as needed.    Historical Provider, MD  furosemide (LASIX) 40 MG tablet Take 40-80 mg by  mouth every morning. 40 mg every am.   Takes and extra tablet if she has increased edema.    Historical Provider, MD  HYDROcodone-acetaminophen [facility]) 10-325 MG per tablet  06/24/15   Historical Provider, MD  HYDROcodone-acetaminophen (NORCO) 7.5-325 MG per tablet Take 1 tablet by mouth every 6 (six) hours as needed for moderate pain.    Historical Provider, MD  Menthol, Topical Analgesic, (BENGAY VANISHING SCENT) 2.5 % GEL Apply 1 application topically 3 (three) times daily as needed (pain).    Historical Provider, MD  nystatin-triamcinolone (MYCOLOG II) cream Apply 1 application topically 2 (two) times daily. Applied to skin around the G-tube site twice daily for 2 weeks and then as needed 10/30/13    Malissa Hippo, MD  polyethylene glycol powder (GLYCOLAX/MIRALAX) powder Take 255 g by mouth daily. 07/08/15   Malissa Hippo, MD  PROAIR HFA 108 [phone] BASE) MCG/ACT inhaler  07/10/15   Historical Provider, MD  simvastatin (ZOCOR) 20 MG tablet Take 20 mg by mouth every morning.     Historical Provider, MD  traZODone (DESYREL) 100 MG tablet Take 200 mg by mouth at bedtime.     Historical Provider, MD   BP 178/88 mmHg  Pulse 72  Temp(Src) 98.2 F (36.8 C)  Resp 18  Wt 105 lb (47.628 kg)  SpO2 98% Physical Exam  Constitutional: She appears well-developed and well-nourished.  Abdominal: There is no tenderness.  Left upper quadrant PEG tube site. Minimal erythema around the stoma.  Musculoskeletal: Normal range of motion.  Neurological: She is alert.  Skin: Skin is warm.    ED Course  Gastrostomy tube replacement Date/Time: 07/18/2015 8:00 PM Performed by: Benjiman Core Authorized by: Benjiman Core Consent: Verbal consent obtained. Written consent not obtained. Risks and benefits: risks, benefits and alternatives were discussed Consent given by: patient Patient understanding: patient states understanding of the procedure being performed Required items: required blood products, implants, devices, and special equipment available Patient identity confirmed: verbally with patient and arm band Preparation: Patient was prepped and draped in the usual sterile fashion. Local anesthesia used: no Patient sedated: no Comments: 20 french tube replaced and placement verified.    (including critical care time) Labs Review Labs Reviewed  URINALYSIS, ROUTINE W REFLEX MICROSCOPIC (NOT AT [facility]) - Abnormal; Notable for the following:    APPearance HAZY (*)    Specific Gravity, Urine <1.005 (*)    Hgb urine dipstick TRACE (*)    Leukocytes, UA LARGE (*)    All other components within normal limits  URINE MICROSCOPIC-ADD ON - Abnormal; Notable for the following:    Squamous Epithelial /  LPF FEW (*)    Bacteria, UA MANY (*)    All other components within normal limits    Imaging Review Dg Abd 1 View  07/17/2015   CLINICAL DATA:  G-tube placement.  Initial encounter.  EXAM: ABDOMEN - 1 VIEW  COMPARISON:  Abdominal radiograph performed 02/16/2015  FINDINGS: Injection of contrast into the G-tube demonstrates filling of the stomach and outlining of the G-tube at the level of the body of the stomach. A circumferential filling defect at the more distal body of the stomach is thought to be transient in nature, without evidence for obstruction. Contrast progresses into the proximal duodenum.  Diffuse vascular calcifications are seen. The visualized bowel gas pattern is grossly unremarkable. The visualized lung bases are grossly clear. Right convex lumbar scoliosis is noted, with underlying degenerative change.  IMPRESSION: 1. Contrast injected into the G-tube is  seen filling the stomach. Apparent circumferential filling defect at the more distal body of the stomach is thought to be transient in nature, without evidence for obstruction. 2. Diffuse vascular calcifications seen. 3. Right convex lumbar scoliosis, with underlying degenerative change.   Electronically Signed   By: Roanna Raider M.D.   On: 07/17/2015 20:49   I have personally reviewed and evaluated these images and lab results as part of my medical decision-making.   EKG Interpretation None      MDM   Final diagnoses:  Dislodged gastrostomy tube    Patient had her PEG tube came out. Replaced by me and verified by x-ray. Will follow-up with her gastroenterologist.      Benjiman Core, MD 07/19/15 [phone]

## 2015-07-19 ENCOUNTER — Telehealth (INDEPENDENT_AMBULATORY_CARE_PROVIDER_SITE_OTHER): Payer: Self-pay | Admitting: *Deleted

## 2015-07-19 NOTE — Telephone Encounter (Signed)
Micael Hampshire, caregiver, called to let Dr. Karilyn Cota know Colleen Perry's feeding tube came out on 08/20/6 and had to go to the ED to have it put back in. During the night it came out again. Her daughter came over Sunday morning to put it back in. The milk will not go down like it should and didn't know if Dr. Karilyn Cota needed to look at it. The return phone number is [phone].

## 2015-07-19 NOTE — Telephone Encounter (Signed)
Colleen Perry called and made aware that Dr.Rehman is out of town. I told her that they may consider going to ED . She states that if they do they will , otherwise patient may want to wait until Dr.Rehman is back on Wednesday. Colleen Perry will call me on Wednesday.

## 2015-07-21 NOTE — Telephone Encounter (Signed)
Colleen Perry called and states that Jennifier's tube is,per patient, hurting. Milk is not going down like it should. Patient would like for Dr.Rehman to look at it to see if [name] put it back in correctly this weekend. Forwarded to Dr.Rehman to review.

## 2015-07-22 ENCOUNTER — Encounter (HOSPITAL_COMMUNITY)
Admission: RE | Disposition: A | Discharge status: Home / Self Care | Payer: Self-pay | Source: Ambulatory Visit | Attending: Internal Medicine

## 2015-07-22 ENCOUNTER — Ambulatory Visit (HOSPITAL_COMMUNITY)
Admission: RE | Admit: 2015-07-22 | Discharge: 2015-07-22 | Disposition: A | Discharge status: Home / Self Care | Payer: Medicare Other | Source: Ambulatory Visit | Attending: Internal Medicine | Admitting: Internal Medicine

## 2015-07-22 ENCOUNTER — Encounter (HOSPITAL_COMMUNITY): Payer: Self-pay

## 2015-07-22 DIAGNOSIS — R1312 Dysphagia, oropharyngeal phase: Secondary | ICD-10-CM | POA: Diagnosis not present

## 2015-07-22 DIAGNOSIS — F419 Anxiety disorder, unspecified: Secondary | ICD-10-CM | POA: Insufficient documentation

## 2015-07-22 DIAGNOSIS — Y833 Surgical operation with formation of external stoma as the cause of abnormal reaction of the patient, or of later complication, without mention of misadventure at the time of the procedure: Secondary | ICD-10-CM | POA: Insufficient documentation

## 2015-07-22 DIAGNOSIS — K9423 Gastrostomy malfunction: Secondary | ICD-10-CM | POA: Insufficient documentation

## 2015-07-22 DIAGNOSIS — I10 Essential (primary) hypertension: Secondary | ICD-10-CM | POA: Insufficient documentation

## 2015-07-22 DIAGNOSIS — Z951 Presence of aortocoronary bypass graft: Secondary | ICD-10-CM | POA: Diagnosis not present

## 2015-07-22 DIAGNOSIS — Z86718 Personal history of other venous thrombosis and embolism: Secondary | ICD-10-CM | POA: Diagnosis not present

## 2015-07-22 DIAGNOSIS — I252 Old myocardial infarction: Secondary | ICD-10-CM | POA: Insufficient documentation

## 2015-07-22 DIAGNOSIS — M349 Systemic sclerosis, unspecified: Secondary | ICD-10-CM | POA: Diagnosis not present

## 2015-07-22 DIAGNOSIS — I2510 Atherosclerotic heart disease of native coronary artery without angina pectoris: Secondary | ICD-10-CM | POA: Diagnosis not present

## 2015-07-22 HISTORY — PX: PEG PLACEMENT: SHX5437

## 2015-07-22 SURGERY — REPLACEMENT, PEG TUBE, WITHOUT ENDOSCOPY
Anesthesia: LOCAL

## 2015-07-22 NOTE — Discharge Instructions (Signed)
Gastrostomy Tube Home Guide  A gastrostomy tube is a tube that is surgically placed into the stomach. It is also called a "G-tube." G-tubes are used when a person is unable to eat and drink enough on their own to stay healthy. The tube is inserted into the stomach through a small cut (incision) in the skin. This tube is used for:   Feeding.   Giving medication.  GASTROSTOMY TUBE CARE   Wash your hands with soap and water.   Remove the old dressing (if any). Some styles of G-tubes may need a dressing inserted between the skin and the G-tube. Other types of G-tubes do not require a dressing. Ask your health care provider if a dressing is needed.   Check the area where the tube enters the skin (insertion site) for redness, swelling, or pus-like (purulent) drainage. A small amount of clear or tan liquid drainage is normal. Check to make sure scar tissue (skin) is not growing around the insertion site. This could have a raised, bumpy appearance.   A cotton swab can be used to clean the skin around the tube:   When the G-tube is first put in, a normal saline solution or water can be used to clean the skin.   Mild soap and warm water can be used when the skin around the G-tube site has healed.   Roll the cotton swab around the G-tube insertion site to remove any drainage or crusting at the insertion site.  STOMACH RESIDUALS  Feeding tube residuals are the amount of liquids that are in the stomach at any given time. Residuals may be checked before giving feedings, medications, or as instructed by your health care provider.   Ask your health care provider if there are instances when you would not start tube feedings depending on the amount or type of contents withdrawn from the stomach.   Check residuals by attaching a syringe to the G-tube and pulling back on the syringe plunger. Note the amount, and return the residual back into the stomach.  FLUSHING THE G-TUBE   The G-tube should be periodically flushed with  clean warm water to keep it from clogging.   Flush the G-tube after feedings or medications. Draw up 30 mL of warm water in a syringe. Connect the syringe to the G-tube and slowly push the water into the tube.   Do not push feedings, medications, or flushes rapidly. Flush the G-tube gently and slowly.   Only use syringes made for G-tubes to flush medications or feedings.   Your health care provider may want the G-tube flushed more often or with more water. If this is the case, follow your health care provider's instructions.  FEEDINGS  Your health care provider will determine whether feedings are given as a bolus (a certain amount given at one time and at scheduled times) or whether feedings will be given continuously on a feeding pump.    Formulas should be given at room temperature.   If feedings are continuous, no more than 4 hours worth of feedings should be placed in the feeding bag. This helps prevent spoilage or accidental excess infusion.   Cover and place unused formula in the refrigerator.   If feedings are continuous, stop the feedings when medications or flushes are given. Be sure to restart the feedings.   Feeding bags and syringes should be replaced as instructed by your health care provider.  GIVING MEDICATION    In general, it is best if all medications   are in a liquid form for G-tube administration. Liquid medications are less likely to clog the G-tube.   Mix the liquid medication with 30 mL (or amount recommended by your health care provider) of warm water.   Draw up the medication into the syringe.   Attach the syringe to the G-tube and slowly push the mixture into the G-tube.   After giving the medication, draw up 30 mL of warm water in the syringe and slowly flush the G-tube.   For pills or capsules, check with your health care provider first before crushing medications. Some pills are not effective if they are crushed. Some capsules are sustained-release medications.   If  appropriate, crush the pill or capsule and mix with 30 mL of warm water. Using the syringe, slowly push the medication through the tube, then flush the tube with another 30 mL of tap water.  G-TUBE PROBLEMS  G-tube was pulled out.   Cause: May have been pulled out accidentally.   Solutions: Cover the opening with clean dressing and tape. Call your health care provider right away. The G-tube should be put in as soon as possible (within 4 hours) so the G-tube opening (tract) does not close. The G-tube needs to be put in at a health care setting. An X-ray needs to be done to confirm placement before the G-tube can be used again.  Redness, irritation, soreness, or foul odor around the gastrostomy site.   Cause: May be caused by leakage or infection.   Solutions: Call your health care provider right away.  Large amount of leakage of fluid or mucus-like liquid present (a large amount means it soaks clothing).   Cause: Many reasons could cause the G-tube to leak.   Solutions: Call your health care provider to discuss the amount of leakage.  Skin or scar tissue appears to be growing where tube enters skin.    Cause: Tissue growth may develop around the insertion site if the G-tube is moved or pulled on excessively.   Solutions: Secure tube with tape so that excess movement does not occur. Call your health care provider.  G-tube is clogged.   Cause: Thick formula or medication.   Solutions: Try to slowly push warm water into the tube with a large syringe. Never try to push any object into the tube to unclog it. Do not force fluid into the G-tube. If you are unable to unclog the tube, call your health care provider right away.  TIPS   Head of bed (HOB) position refers to the upright position of a person's upper body.   When giving medications or a feeding bolus, keep the HOB up as told by your health care provider. Do this during the feeding and for 1 hour after the feeding or medication administration.   If  continuous feedings are being given, it is best to keep the HOB up as told by your health care provider. When ADLs (activities of daily living) are performed and the HOB needs to be flat, be sure to turn the feeding pump off. Restart the feeding pump when the HOB is returned to the recommended height.   Do not pull or put tension on the tube.   To prevent fluid backflow, kink the G-tube before removing the cap or disconnecting a syringe.   Check the G-tube length every day. Measure from the insertion site to the end of the G-tube. If the length is longer than previous measurements, the tube may be coming out. Call   your health care provider if you notice increasing G-tube length.   Oral care, such as brushing teeth, must be continued.   You may need to remove excess air (vent) from the G-tube. Your health care provider will tell you if this is needed.   Always call your health care provider if you have questions or problems with the G-tube.  SEEK IMMEDIATE MEDICAL CARE IF:    You have severe abdominal pain, tenderness, or abdominal bloating (distension).   You have nausea or vomiting.   You are constipated or have problems moving your bowels.   The G-tube insertion site is red, swollen, has a foul smell, or has yellow or brown drainage.   You have difficulty breathing or shortness of breath.   You have a fever.   You have a large amount of feeding tube residuals.   The G-tube is clogged and cannot be flushed.  MAKE SURE YOU:    Understand these instructions.   Will watch your condition.   Will get help right away if you are not doing well or get worse.  Document Released: 01/22/2002 Document Revised: 03/30/2014 Document Reviewed: 07/21/2013  ExitCare Patient Information 2015 [employer]. This information is not intended to replace advice given to you by your health care provider. Make sure you discuss any questions you have with your health care provider.

## 2015-07-22 NOTE — Progress Notes (Signed)
Dr. Karilyn Cota removed and rep-inserted with patient's current 20French Peg tube. Patency checked by Dr. Karilyn Cota.

## 2015-07-22 NOTE — H&P (Signed)
Colleen Perry is an 81 y.o. female.   Chief Complaint: G-tube is not working. HPI: Patient is 80 year old Caucasian female with multiple medical problems including oropharyngeal dysphagia for which she had PEG placed few years ago. Tube was changed by me in November 2015. Patient was seen in the office on 07/06/2015 and 2 was working well. Gastrostomy tube came out accidentally on 07/17/2015 and patient was brought to emergency room and the 2 was placed back. Tube came out again and her daughter Ms. [name] who is an RN inserted to back but it has not been working since. Feeding regurgitates every time she feet herself. She denies abdominal pain has not had fever or chills.  Past Medical History  Diagnosis Date  . Hypertension   . Anxiety   . Neuromuscular disorder   . Scleroderma   . Chronic cough   . Oropharyngeal dysphagia   . Recurrent abdominal pain   . CAD (coronary artery disease)   . Myocardial infarction   . DVT (deep venous thrombosis)     Past Surgical History  Procedure Laterality Date  . Cardiac surgery    . Coronary artery bypass graft    . Ankle surgery      40 years ago  . Cholecystectomy      15 years ago  . Abdominal hysterectomy    . Feeding tube placement    . Peg placement N/A 10/30/2013    Procedure: PERCUTANEOUS ENDOSCOPIC GASTROSTOMY (PEG) REPLACEMENT;  Surgeon: Malissa Hippo, MD;  [facility];  Service: Endoscopy;  Laterality: N/A;  245  . Peg placement N/A 10/21/2014    Procedure: PERCUTANEOUS ENDOSCOPIC GASTROSTOMY (PEG) REPLACEMENT;  Surgeon: Malissa Hippo, MD;  Location[facility];  Service: Endoscopy;  Laterality: N/A;  . Abdominal aortagram N/A 10/02/2014    Procedure: ABDOMINAL AORTAGRAM;  Surgeon: Sherren Kerns, MD;  Location: [facility] CATH LAB;  Service: Cardiovascular;  Laterality: N/A;    Family History  Problem Relation Age of Onset  . Stroke Mother   . Peripheral vascular disease Mother     amputation  . Heart  disease Father   . Heart attack Father   . Heart attack Sister   . Stroke Brother    Social History:  reports that she has never smoked. She has never used smokeless tobacco. She reports that she does not drink alcohol or use illicit drugs.  Allergies: No Known Allergies  Medications Prior to Admission  Medication Sig Dispense Refill  . furosemide (LASIX) 40 MG tablet Take 40-80 mg by mouth every morning. 40 mg every am.   Takes and extra tablet if she has increased edema.    . Menthol, Topical Analgesic, (BENGAY VANISHING SCENT) 2.5 % GEL Apply 1 application topically 3 (three) times daily as needed (pain).    . nystatin-triamcinolone (MYCOLOG II) cream Apply 1 application topically 2 (two) times daily. Applied to skin around the G-tube site twice daily for 2 weeks and then as needed 30 g 0  . traZODone (DESYREL) 100 MG tablet Take 200 mg by mouth at bedtime.     . ALPRAZolam (XANAX) 1 MG tablet Take 0.5-1 mg by mouth 4 (four) times daily as needed. For anxiety    . benazepril (LOTENSIN) 40 MG tablet Take 40 mg by mouth every morning.    . bisacodyl (DULCOLAX) 5 MG EC tablet Take 5 mg by mouth at bedtime. For constipation    . cilostazol (PLETAL) 100 MG tablet Take 100  mg by mouth daily.    . DHA-EPA-Flaxseed Oil-Vitamin E (THERA TEARS NUTRITION PO) Take by mouth.    . DHA-EPA-Flaxseed Oil-Vitamin E (THERA TEARS NUTRITION PO) Take 2 drops by mouth 4 (four) times daily as needed.    Marland Kitchen HYDROcodone-acetaminophen (NORCO) 10-325 MG per tablet     . HYDROcodone-acetaminophen (NORCO) 7.5-325 MG per tablet Take 1 tablet by mouth every 6 (six) hours as needed for moderate pain.    . polyethylene glycol powder (GLYCOLAX/MIRALAX) powder Take 255 g by mouth daily. 255 g 3  . PROAIR HFA 108 (90 BASE) MCG/ACT inhaler     . simvastatin (ZOCOR) 20 MG tablet Take 20 mg by mouth every morning.       No results found for this or any previous visit (from the past 48 hour(s)). No results  found.  ROS  Blood pressure 122/78, pulse 56, temperature 97.8 F (36.6 C), temperature source Oral, resp. rate 13, SpO2 96 %. Physical Exam  Patient is alert and in no acute distress. She has hearing impairment. She has poor dentition. Gastrostomy tube is in place not freely movable. Abdomen is soft and nontender.  Assessment/Plan Malfunctioning gastrostomy tube.  Isaid Salvia U 07/22/2015, 11:23 AM

## 2015-07-22 NOTE — Op Note (Addendum)
  Procedure: Gastrostomy tube placement.   Indication; Patient's gastrostomy tube came out accidentally on 07/17/2015. She was seen in emergency room and balloon gastrostomy replacement tube was reinserted and position and firm with Gastrografin study. Tube came out again and her daughter who is an Charity fundraiser pushed it back but it has not worked ever since.  Findings; Gastrostomy tube was in place but not freely mobile. Balloon was deflated and this too was removed. Gastric fistula track was evaluated by passing 16 French red rubber catheter. 20 French angled balloon gastrostomy tube was advanced through existing fistula without any difficulty. Balloon was filled with 6 mL of sterile water. Reflux of gastric contents noted through the tube when patient coughed. 60 ML of water was gently pushed into gastric lumen and did not regurgitate. Bolster was brought closer to the skin and site covered with dressing.  Assessment; Gastrostomy tube malfunction. Gastrostomy 2 was removed and reinserted without any difficulty and is functioning fine  Recommendations;  Resume gastric feeding as before.

## 2015-07-22 NOTE — Telephone Encounter (Signed)
Will change if patient comes to day hospital

## 2015-07-22 NOTE — Telephone Encounter (Signed)
Patient is being brought to the day hospital by her niece, Harriett Sine. Dr.Rehman is to check her tube.

## 2015-07-27 ENCOUNTER — Encounter (HOSPITAL_COMMUNITY): Payer: Self-pay | Admitting: Internal Medicine

## 2015-10-12 ENCOUNTER — Ambulatory Visit (INDEPENDENT_AMBULATORY_CARE_PROVIDER_SITE_OTHER): Payer: Medicare Other | Admitting: Internal Medicine

## 2015-10-12 ENCOUNTER — Encounter (INDEPENDENT_AMBULATORY_CARE_PROVIDER_SITE_OTHER): Payer: Self-pay | Admitting: Internal Medicine

## 2015-10-12 ENCOUNTER — Telehealth (INDEPENDENT_AMBULATORY_CARE_PROVIDER_SITE_OTHER): Payer: Self-pay | Admitting: Internal Medicine

## 2015-10-12 ENCOUNTER — Ambulatory Visit (HOSPITAL_COMMUNITY)
Admission: RE | Admit: 2015-10-12 | Discharge: 2015-10-12 | Disposition: A | Discharge status: Home / Self Care | Payer: Medicare Other | Source: Ambulatory Visit | Attending: Internal Medicine | Admitting: Internal Medicine

## 2015-10-12 VITALS — BP 118/0 | HR 60 | Temp 98.3°F | Ht 62.0 in | Wt 103.6 lb

## 2015-10-12 DIAGNOSIS — G8929 Other chronic pain: Secondary | ICD-10-CM | POA: Diagnosis not present

## 2015-10-12 DIAGNOSIS — R1030 Lower abdominal pain, unspecified: Secondary | ICD-10-CM

## 2015-10-12 DIAGNOSIS — Z931 Gastrostomy status: Secondary | ICD-10-CM | POA: Insufficient documentation

## 2015-10-12 DIAGNOSIS — Z09 Encounter for follow-up examination after completed treatment for conditions other than malignant neoplasm: Secondary | ICD-10-CM

## 2015-10-12 NOTE — Patient Instructions (Signed)
KUB

## 2015-10-12 NOTE — Progress Notes (Signed)
Subjective:    Patient ID: Colleen Perry, female    DOB: 03-25-1932, 81 y.o.   MRN: [id number]  HPI Here today with c/o abdominal pain. Hx of pharyngeal phase dysphagia with recurrent aspiration and had fed with gastrostomy for the past few years. She has lower abdominal pain since G tube replace. She says she has trouble with the milk going in her tube. She sees milk in the tube after feeding. She says she has had pain every since the tube was reinserted.  She usually has a BM every 3 days with a laxative. She is using 2cans of Ensure a day and sometimes 3.  Her weight today is 103.6. Her last weight in August was 106. Hx of scleroderma and esophageal dysmotility       07/22/2015   Indication; Patient's gastrostomy tube came out accidentally on 07/17/2015. She was seen in emergency room and balloon gastrostomy replacement tube was reinserted and position and firm with Gastrografin study. Tube came out again and her daughter who is an Charity fundraiserN pushed it back but it has not worked ever since.         Assessment; Gastrostomy tube malfunction. Gastrostomy 2 was removed and reinserted without any difficulty and is functioning fine    Review of Systems Past Medical History  Diagnosis Date  . Hypertension   . Anxiety   . Neuromuscular disorder (HCC)   . Scleroderma (HCC)   . Chronic cough   . Oropharyngeal dysphagia   . Recurrent abdominal pain   . CAD (coronary artery disease)   . Myocardial infarction (HCC)   . DVT (deep venous thrombosis) [facility](HCC)     Past Surgical History  Procedure Laterality Date  . Cardiac surgery    . Coronary artery bypass graft    . Ankle surgery      40 years ago  . Cholecystectomy      15 years ago  . Abdominal hysterectomy    . Feeding tube placement    . Peg placement N/A 10/30/2013    Procedure: PERCUTANEOUS ENDOSCOPIC GASTROSTOMY (PEG) REPLACEMENT;  Surgeon: Malissa HippoNajeeb U Rehman, MD;  [facility];  Service: Endoscopy;  Laterality:  N/A;  245  . Peg placement N/A 10/21/2014    Procedure: PERCUTANEOUS ENDOSCOPIC GASTROSTOMY (PEG) REPLACEMENT;  Surgeon: Malissa HippoNajeeb U Rehman, MD;  [facility];  Service: Endoscopy;  Laterality: N/A;  . Abdominal aortagram N/A 10/02/2014    Procedure: ABDOMINAL AORTAGRAM;  Surgeon: Sherren Kernsharles E Fields, MD;  [facility];  Service: Cardiovascular;  Laterality: N/A;  . Peg placement N/A 07/22/2015    Procedure: PERCUTANEOUS ENDOSCOPIC GASTROSTOMY (PEG) REPLACEMENT;  Surgeon: Malissa HippoNajeeb U Rehman, MD;  [facility]: [facility];  Service: Endoscopy;  Laterality: N/A;    No Known Allergies  Current Outpatient Prescriptions on File Prior to Visit  Medication Sig Dispense Refill  . ALPRAZolam (XANAX) 1 MG tablet Take 0.5-1 mg by mouth 4 (four) times daily as needed. For anxiety    . bisacodyl (DULCOLAX) 5 MG EC tablet Take 5 mg by mouth at bedtime. For constipation    . HYDROcodone-acetaminophen (NORCO) 10-325 MG per tablet     . HYDROcodone-acetaminophen (NORCO) 7.5-325 MG per tablet Take 1 tablet by mouth every 6 (six) hours as needed for moderate pain.    . polyethylene glycol powder (GLYCOLAX/MIRALAX) powder Take 255 g by mouth daily. 255 g 3  . PROAIR HFA 108 (90 BASE) MCG/ACT inhaler     . benazepril (LOTENSIN) 40  MG tablet Take 40 mg by mouth every morning.    . cilostazol (PLETAL) 100 MG tablet Take 100 mg by mouth daily.    . DHA-EPA-Flaxseed Oil-Vitamin E (THERA TEARS NUTRITION PO) Take by mouth.    . DHA-EPA-Flaxseed Oil-Vitamin E (THERA TEARS NUTRITION PO) Take 2 drops by mouth 4 (four) times daily as needed.    . furosemide (LASIX) 40 MG tablet Take 40-80 mg by mouth every morning. 40 mg every am.   Takes and extra tablet if she has increased edema.    . Menthol, Topical Analgesic, (BENGAY VANISHING SCENT) 2.5 % GEL Apply 1 application topically 3 (three) times daily as needed (pain).    . nystatin-triamcinolone (MYCOLOG II) cream Apply 1 application topically 2 (two) times daily.  Applied to skin around the G-tube site twice daily for 2 weeks and then as needed (Patient not taking: Reported on 10/12/2015) 30 g 0  . simvastatin (ZOCOR) 20 MG tablet Take 20 mg by mouth every morning.     . traZODone (DESYREL) 100 MG tablet Take 200 mg by mouth at bedtime.      No current facility-administered medications on file prior to visit.        Objective:   Physical Exam Blood pressure 118/0, pulse 60, temperature 98.3 F (36.8 C), height  (1.575 m), weight 103 lb 9.6 oz (46.993 kg). Alert and oriented. Skin warm and dry. Oral mucosa is moist.   . Sclera anicteric, conjunctivae is pink. Thyroid not enlarged. No cervical lymphadenopathy. Lungs clear. Heart regular rate and rhythm.  Abdomen is soft. Bowel sounds are positive. No hepatomegaly. No abdominal masses felt. Slight tenderness rt lower abdomen..  No edema to lower extremities.   G tube in place. I was unable to irrigate. We do not carry this syringe.        Assessment & Plan:  Abdominal pain, recurrent. Am going to get a KUB to be sure tube is in place. Caregiver will call me this afternoon and let me know how tube does when she irrigates it.  Further recommendations to follow.

## 2015-10-13 ENCOUNTER — Telehealth (INDEPENDENT_AMBULATORY_CARE_PROVIDER_SITE_OTHER): Payer: Self-pay | Admitting: *Deleted

## 2015-10-13 NOTE — Telephone Encounter (Signed)
Per Dr.Rehman - How much liquid and mucous is coming out? Put in half of formula,clamp it, wait 15 minutes then pour in the other half of the formula.  I called Clydie BraunKaren , spoke with her husband. He states that he is going to call her and let her know. If any further questions , she will call office back.

## 2015-10-13 NOTE — Telephone Encounter (Signed)
Clydie BraunKaren, daughter, would like to speak with Dr. Karilyn Cotaehman. Vonette seen Dorene Arerri Setzer, NP yesterday and she order an x-ray for the feeding tube. Everything looked good. When Coy SaunasRosemary puts the milk in the feeding tub, it will pour out with mucus also. It is like her stomach is not absorbing it. It there anything that can be done for her. Her weight is now at 100 lbs. The return phone numbers are [phone] or [phone].

## 2015-10-13 NOTE — Telephone Encounter (Signed)
error 

## 2015-10-14 NOTE — Telephone Encounter (Signed)
Luan Pullingerri- Karen , daughter of patient has left a voice message on my phone. She states that someone here had talked with her Mother today and she did not understand. Karen's phone number is [phone]. She also states that her cousin nancy was going to call office today. Patient per daughter saw you this week, thinking that she was to see Rehman and a X-Ray was done.  I rec'd message 10/13/15 ,please refer to Dr.Rehman's recommendation. I talked with Karen's husband and he was going to tell her what he said.

## 2016-01-18 ENCOUNTER — Ambulatory Visit (INDEPENDENT_AMBULATORY_CARE_PROVIDER_SITE_OTHER): Payer: Medicare Other | Admitting: Internal Medicine

## 2016-01-26 ENCOUNTER — Telehealth (INDEPENDENT_AMBULATORY_CARE_PROVIDER_SITE_OTHER): Payer: Self-pay | Admitting: Internal Medicine

## 2016-01-26 NOTE — Telephone Encounter (Signed)
Colleen Perry, Colleen Perry's niece called saying she's not felt well enough to keep her appointments with Dr. Karilyn Cota but she's been experiencing a lot of constipation. She's wondering if Dr. Karilyn Cota can send a Rx for this to her pharmacy. Colleen Perry also mentioned that Colleen Perry has been taking Dulcolax and Castor Oil for her symptoms. She'd like a phone call regarding this.  Pt's ph# [phone] Thank you.

## 2016-01-26 NOTE — Telephone Encounter (Signed)
This will be addressed with Dr.Rehman then a call back to patient's niece.

## 2016-01-27 ENCOUNTER — Telehealth (INDEPENDENT_AMBULATORY_CARE_PROVIDER_SITE_OTHER): Payer: Self-pay | Admitting: Internal Medicine

## 2016-01-27 NOTE — Telephone Encounter (Signed)
Kennith Gain, a Registered Dietician for [facility] left a message saying Colleen Perry is now being fed by tube. Ms. Morsch daughter didn't know if she has a J-tube or a Peg. Elnita Maxwell would like a phone call with this information. P.S. When you call please ask for "Elnita Maxwell the Dietician."  Cheryl's ph# [phone]  Thank you.

## 2016-01-27 NOTE — Telephone Encounter (Signed)
Per Dr.Rehman patient may take Miralax 8 1/2 -17 grams daily via Peg Tube. I called and told Dahlia Client , who said she was patient's daughter. They will try this and let us know if there is a problem.

## 2016-01-28 NOTE — Telephone Encounter (Signed)
I called Elnita MaxwellCheryl and left a message on her voice mail that the patient had a G-Tube in place.

## 2016-04-26 ENCOUNTER — Telehealth (INDEPENDENT_AMBULATORY_CARE_PROVIDER_SITE_OTHER): Payer: Self-pay | Admitting: *Deleted

## 2016-04-26 NOTE — Telephone Encounter (Signed)
Telephone call to Colleen Perry , a message was left. She was advised that a order had been written and once Dr.Rehman signed it on 04/27/2016 , we would fax it in.

## 2016-04-26 NOTE — Telephone Encounter (Signed)
Patient needs order for 4 can of ensure instead of 3 cans which is what she gets from [facility] -- is this for Dr Karilyn Cotaehman or PCP   Let Marcell AngerNancy Dishmon know please  [phone]

## 2016-08-19 ENCOUNTER — Encounter (HOSPITAL_COMMUNITY): Payer: Self-pay | Admitting: Emergency Medicine

## 2016-08-19 ENCOUNTER — Emergency Department (HOSPITAL_COMMUNITY): Payer: Medicare Other

## 2016-08-19 ENCOUNTER — Emergency Department (HOSPITAL_COMMUNITY)
Admission: EM | Admit: 2016-08-19 | Discharge: 2016-08-19 | Disposition: A | Discharge status: Home / Self Care | Payer: Medicare Other | Attending: Emergency Medicine | Admitting: Emergency Medicine

## 2016-08-19 DIAGNOSIS — I10 Essential (primary) hypertension: Secondary | ICD-10-CM | POA: Insufficient documentation

## 2016-08-19 DIAGNOSIS — Y939 Activity, unspecified: Secondary | ICD-10-CM | POA: Diagnosis not present

## 2016-08-19 DIAGNOSIS — W06XXXA Fall from bed, initial encounter: Secondary | ICD-10-CM | POA: Diagnosis not present

## 2016-08-19 DIAGNOSIS — F17210 Nicotine dependence, cigarettes, uncomplicated: Secondary | ICD-10-CM | POA: Insufficient documentation

## 2016-08-19 DIAGNOSIS — Z79899 Other long term (current) drug therapy: Secondary | ICD-10-CM | POA: Insufficient documentation

## 2016-08-19 DIAGNOSIS — S8011XA Contusion of right lower leg, initial encounter: Secondary | ICD-10-CM | POA: Diagnosis not present

## 2016-08-19 DIAGNOSIS — S8991XA Unspecified injury of right lower leg, initial encounter: Secondary | ICD-10-CM | POA: Diagnosis present

## 2016-08-19 DIAGNOSIS — Y999 Unspecified external cause status: Secondary | ICD-10-CM | POA: Diagnosis not present

## 2016-08-19 DIAGNOSIS — Y929 Unspecified place or not applicable: Secondary | ICD-10-CM | POA: Insufficient documentation

## 2016-08-19 DIAGNOSIS — W19XXXA Unspecified fall, initial encounter: Secondary | ICD-10-CM

## 2016-08-19 DIAGNOSIS — I2510 Atherosclerotic heart disease of native coronary artery without angina pectoris: Secondary | ICD-10-CM | POA: Diagnosis not present

## 2016-08-19 NOTE — ED Triage Notes (Signed)
Pt reports rolling off her bed into the floor. Pt did not hit her head or have LOC. Pt c/o leg pain and back pain. Pt under Hospice Care.

## 2016-08-19 NOTE — ED Provider Notes (Signed)
[facility] Provider Note   CSN: [id number] Arrival date & time: 08/19/16  1221     History   Chief Complaint Chief Complaint  Patient presents with  . Fall    HPI Colleen Perry is a 82 y.o. female.  HPI  Pt was seen at 1230. Per pt and her caregiver, c/o gradual onset and persistence of constant right lower tibial "pain" for the past 3 days. Pain started after she "slid off her bed to the floor" 3 days ago. Pt was ambulatory after and since the fall. Pt's Hospice RN evaluated pt yesterday and dx contusion. Pt's family states pt "wanted an xray" so they brought her to the ED. Denies any other injuries. Denies head injury, no neck or back pain, no abd pain, no CP/SOB, no LOC/AMS, no focal motor weakness, no tinging/numbness in extremities.   Past Medical History:  Diagnosis Date  . Anxiety   . CAD (coronary artery disease)   . Chronic cough   . DVT (deep venous thrombosis) (HCC)   . Hypertension   . Myocardial infarction (HCC)   . Neuromuscular disorder (HCC)   . Oropharyngeal dysphagia   . Recurrent abdominal pain   . Scleroderma [facility])     Patient Active Problem List   Diagnosis Date Noted  . Atherosclerotic PVD with ulceration (HCC) 09/17/2014  . Colles' fracture of right radius 07/21/2013  . Scleroderma (HCC) 01/27/2013  . CAD (coronary atherosclerotic disease) 01/27/2013  . Hypertension 01/27/2013  . Dysphagia, pharyngoesophageal phase 01/27/2013  . Peristomal dermatitis 01/27/2013  . Anxiety 01/27/2013    Past Surgical History:  Procedure Laterality Date  . ABDOMINAL AORTAGRAM N/A 10/02/2014   Procedure: ABDOMINAL Ronny Flurry;  Surgeon: Sherren Kerns, MD;  Location: [facility] CATH LAB;  Service: Cardiovascular;  Laterality: N/A;  . ABDOMINAL HYSTERECTOMY    . ANKLE SURGERY     40 years ago  . CARDIAC SURGERY    . CHOLECYSTECTOMY     15 years ago  . CORONARY ARTERY BYPASS GRAFT    . feeding tube placement    . PEG PLACEMENT N/A 10/30/2013   Procedure: PERCUTANEOUS ENDOSCOPIC GASTROSTOMY (PEG) REPLACEMENT;  Surgeon: Malissa Hippo, MD;  [facility];  Service: Endoscopy;  Laterality: N/A;  245  . PEG PLACEMENT N/A 10/21/2014   Procedure: PERCUTANEOUS ENDOSCOPIC GASTROSTOMY (PEG) REPLACEMENT;  Surgeon: Malissa Hippo, MD;  [facility];  Service: Endoscopy;  Laterality: N/A;  . PEG PLACEMENT N/A 07/22/2015   Procedure: PERCUTANEOUS ENDOSCOPIC GASTROSTOMY (PEG) REPLACEMENT;  Surgeon: Malissa Hippo, MD;  [facility];  Service: Endoscopy;  Laterality: N/A;    OB History    Gravida Para Term Preterm AB Living   3 3 3          SAB TAB Ectopic Multiple Live Births                   Home Medications    Prior to Admission medications   Medication Sig Start Date End Date Taking? Authorizing Provider  ALPRAZolam Prudy Feeler) 1 MG tablet Take 0.5-1 mg by mouth 4 (four) times daily as needed. For anxiety   Yes Historical Provider, MD  HYDROcodone-acetaminophen (NORCO) 10-325 MG per tablet Take 1 tablet by mouth every 6 (six) hours as needed for moderate pain.  06/24/15  Yes Historical Provider, MD  PROAIR HFA 108 (90 BASE) MCG/ACT inhaler Inhale 2 puffs into the lungs every 4 (four) hours as needed for wheezing or shortness of breath.  07/10/15  Yes Historical Provider, MD    Family History Family History  Problem Relation Age of Onset  . Stroke Mother   . Peripheral vascular disease Mother     amputation  . Heart disease Father   . Heart attack Father   . Heart attack Sister   . Stroke Brother     Social History Social History  Substance Use Topics  . Smoking status: Never Smoker  . Smokeless tobacco: Never Used  . Alcohol use No     Allergies   Review of patient's allergies indicates no known allergies.   Review of Systems Review of Systems ROS: Statement: All systems negative except as marked or noted in the HPI; Constitutional: Negative for fever and chills. ; ; Eyes: Negative for  eye pain, redness and discharge. ; ; ENMT: Negative for ear pain, hoarseness, nasal congestion, sinus pressure and sore throat. ; ; Cardiovascular: Negative for chest pain, palpitations, diaphoresis, dyspnea and peripheral edema. ; ; Respiratory: Negative for cough, wheezing and stridor. ; ; Gastrointestinal: Negative for nausea, vomiting, diarrhea, abdominal pain, blood in stool, hematemesis, jaundice and rectal bleeding. . ; ; Genitourinary: Negative for dysuria, flank pain and hematuria. ; ; Musculoskeletal: +right lower tibia bruising and tenderness. Negative for back pain and neck pain. Negative for deformity..; ; Skin: Negative for pruritus, rash, abrasions, blisters, and skin lesion.; ; Neuro: Negative for headache, lightheadedness and neck stiffness. Negative for weakness, altered level of consciousness, altered mental status, extremity weakness, paresthesias, involuntary movement, seizure and syncope.       Physical Exam Updated Vital Signs BP 129/73 (BP Location: Left Arm)   Pulse (!) 51   Temp 98.9 F (37.2 C) (Temporal)   Resp 18   Ht 5\' 4"  (1.626 m)   Wt 92 lb (41.7 kg)   SpO2 100%   BMI 15.79 kg/m   Physical Exam 1235: Physical examination:  Nursing notes reviewed; Vital signs and O2 SAT reviewed;  Constitutional: Well developed, Well nourished, Well hydrated, In no acute distress; Head:  Normocephalic, atraumatic; Eyes: EOMI, PERRL, No scleral icterus; ENMT: Mouth and pharynx normal, Mucous membranes moist; Neck: Supple, Full range of motion, No lymphadenopathy; Cardiovascular: Regular rate and rhythm, No gallop; Respiratory: Breath sounds clear & equal bilaterally, No wheezes.  Speaking full sentences with ease, Normal respiratory effort/excursion; Chest: Nontender, Movement normal; Abdomen: Soft, Nontender, Nondistended, Normal bowel sounds; Genitourinary: No CVA tenderness; Spine:  No midline CS, TS, LS tenderness.;; Extremities: Pulses normal, No tenderness right  hip/knee/ankle/foot. Pelvis stable. +lower anterior tibia with mild TTP, localized edema, and small ecchymosis. No soft tissue crepitus, no open wounds. NMS intact right foot. Muscles compartments soft. No calf tenderness, edema or asymmetry.; Neuro: AA&Ox3, Major CN grossly intact.  Speech clear. No gross focal motor or sensory deficits in extremities.; Skin: Color normal, Warm, Dry.   ED Treatments / Results  Labs (all labs ordered are listed, but only abnormal results are displayed) Labs Reviewed - No data to display  EKG  EKG Interpretation None       Radiology   Procedures Procedures (including critical care time)  Medications Ordered in ED Medications - No data to display   Initial Impression / Assessment and Plan / ED Course  I have reviewed the triage vital signs and the nursing notes.  Pertinent labs & imaging results that were available during my care of the patient were reviewed by me and considered in my medical decision making (see chart for details).  MDM Reviewed:  previous chart, nursing note and vitals Interpretation: x-ray   Dg Tibia/fibula Right Result Date: 08/19/2016 CLINICAL DATA:  Fall, leg pain EXAM: RIGHT TIBIA AND FIBULA - 2 VIEW COMPARISON:  None. FINDINGS: No fracture or dislocation is seen. Mild degenerative changes of the knee and ankle. Vascular calcifications and surgical clips. IMPRESSION: No fracture or dislocation is seen. Electronically Signed   By: Charline Bills M.D.   On: 08/19/2016 13:04   Dg Ankle Complete Right Result Date: 08/19/2016 CLINICAL DATA:  Fall, leg pain EXAM: RIGHT ANKLE - COMPLETE 3+ VIEW COMPARISON:  None. FINDINGS: No fracture or dislocation is seen. The ankle mortise is intact. Mild degenerative changes. Vascular calcifications. The base of the fifth metatarsal is unremarkable. IMPRESSION: No fracture or dislocation is seen. Electronically Signed   By: Charline Bills M.D.   On: 08/19/2016 13:05   Dg Knee Complete  4 Views Right Result Date: 08/19/2016 CLINICAL DATA:  Fall, leg pain EXAM: RIGHT KNEE - COMPLETE 4+ VIEW COMPARISON:  None. FINDINGS: No fracture or dislocation is seen. Mild degenerative changes. Vascular calcifications and surgical clips. No suprapatellar knee joint effusion. IMPRESSION: No fracture or dislocation is seen. Electronically Signed   By: Charline Bills M.D.   On: 08/19/2016 13:04     1345:  XR reassuring. Tx symptomatically, f/u PMD. Dx and testing d/w pt and family.  Questions answered.  Verb understanding, agreeable to d/c home with outpt f/u.   Final Clinical Impressions(s) / ED Diagnoses   Final diagnoses:  None    New Prescriptions New Prescriptions   No medications on file     Samuel Jester, DO 08/22/16 1746

## 2016-08-19 NOTE — Discharge Instructions (Signed)
Take your usual prescriptions as previously directed.  Apply moist heat or ice to the area(s) of discomfort, for 15 minutes at a time, several times per day for the next few days.  Do not fall asleep on a heating or ice pack.  Call your regular medical doctor on Monday to schedule a follow up appointment this week.  Return to the Emergency Department immediately if worsening. ° °

## 2016-08-19 NOTE — ED Notes (Signed)
Pt transported to xray with Ethan.

## 2016-11-27 DEATH — deceased

## 2017-08-24 IMAGING — DX DG TIBIA/FIBULA 2V*R*
2 series · 2 of 2 positions shown · non-contrast
Comparison: None.

CLINICAL DATA: Fall, leg pain

EXAM:
RIGHT TIBIA AND FIBULA - 2 VIEW

[tibia ap]
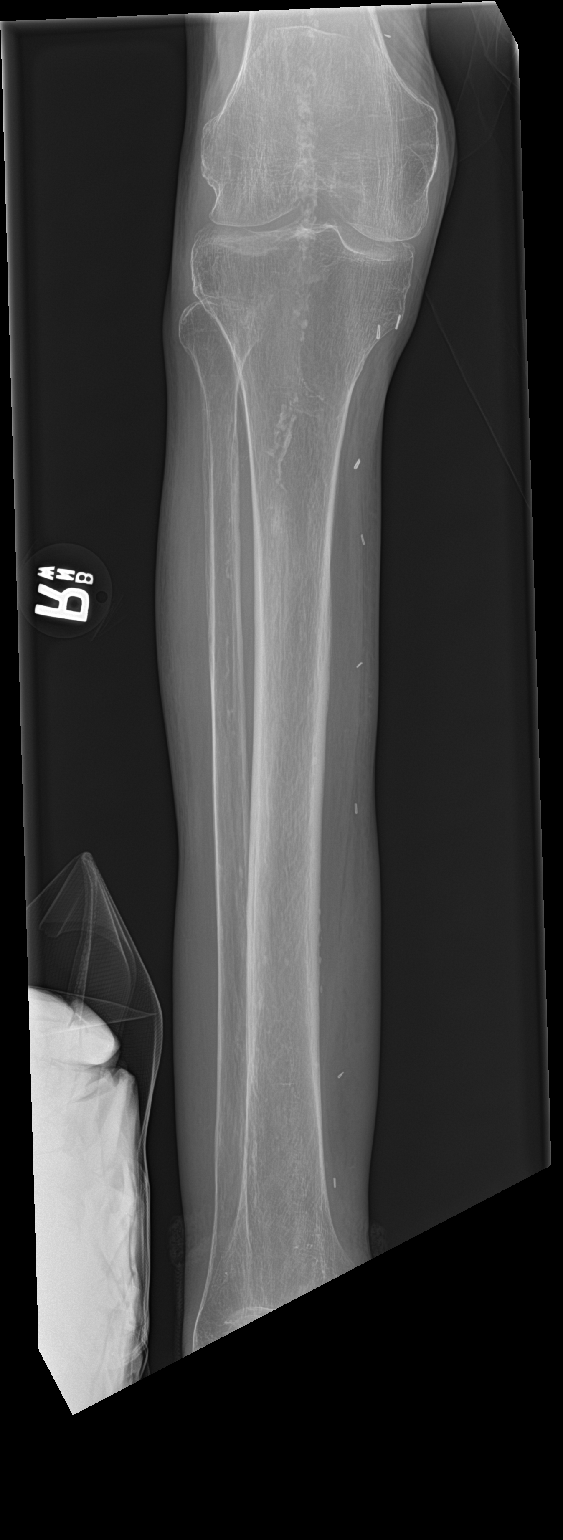

[tibia lat]
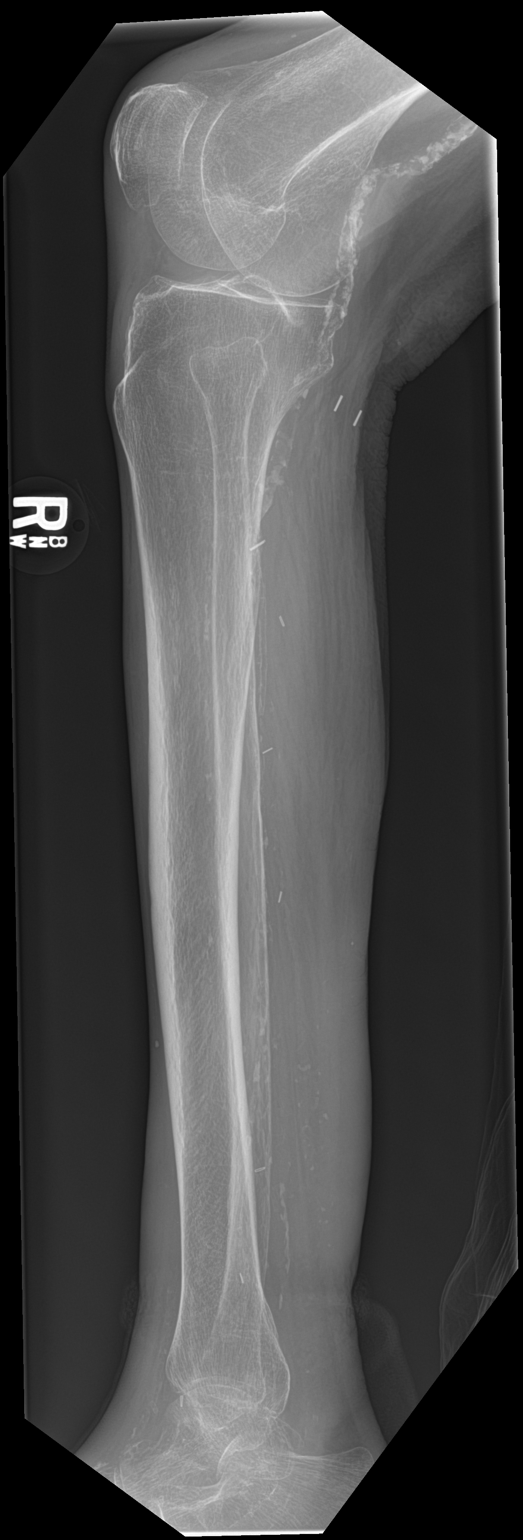

[2 of 2 positions shown; findings below may reference images not displayed]

FINDINGS: No fracture or dislocation is seen.

Mild degenerative changes of the knee and ankle.

Vascular calcifications and surgical clips.
IMPRESSION: No fracture or dislocation is seen.

## 2017-08-24 IMAGING — DX DG ANKLE COMPLETE 3+V*R*
3 series · 3 of 3 positions shown · non-contrast
Comparison: None.

CLINICAL DATA: Fall, leg pain

EXAM:
RIGHT ANKLE - COMPLETE 3+ VIEW

[ankle ap]
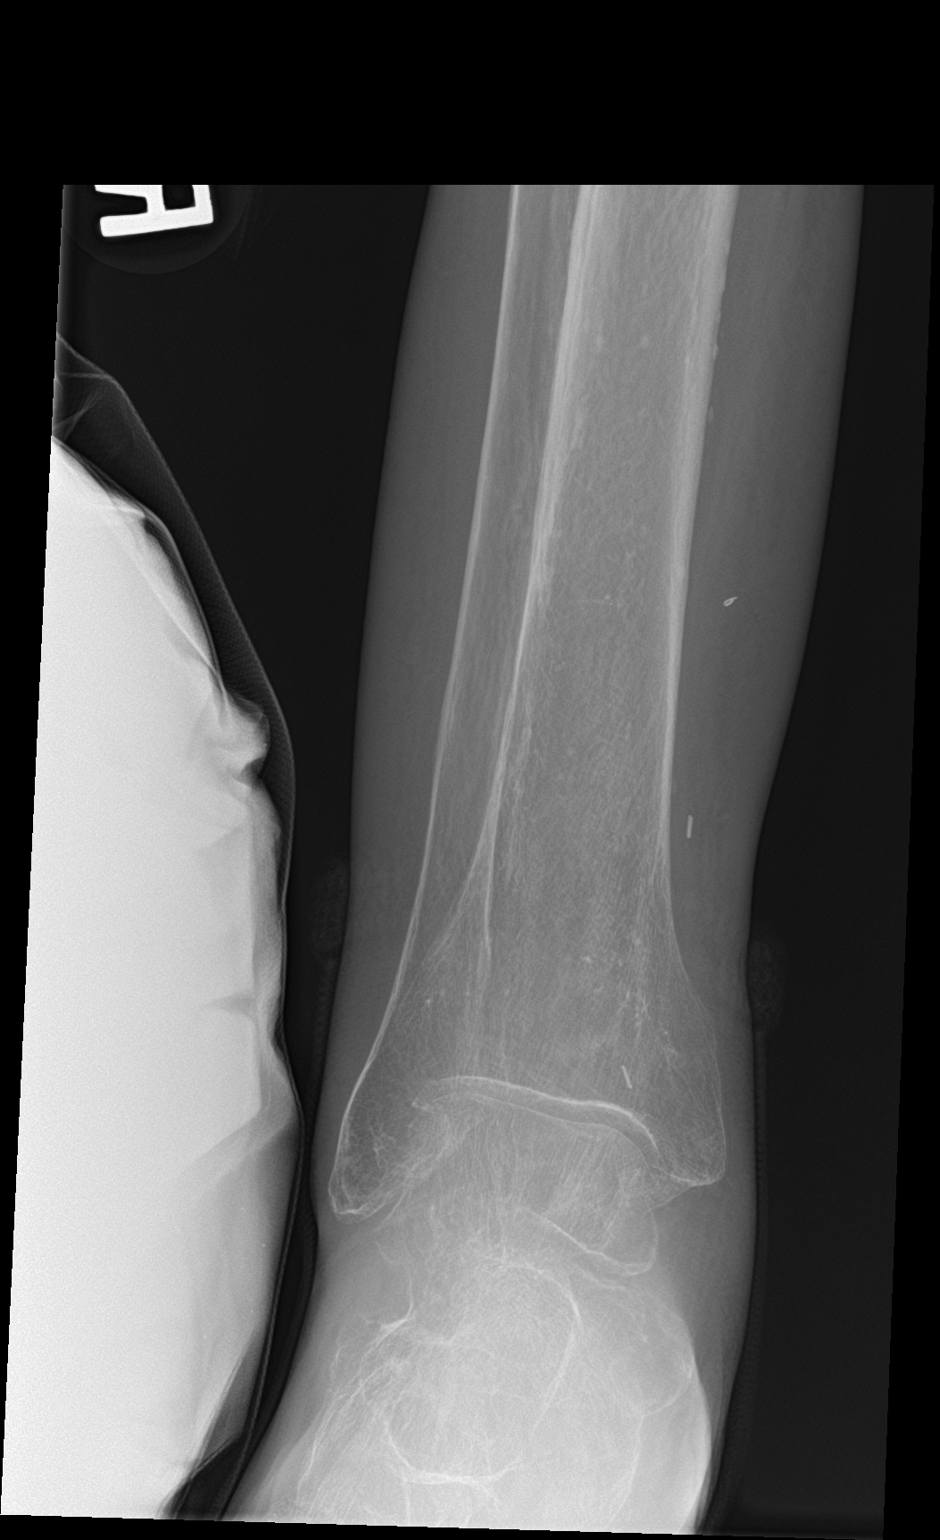

[ankle obl]
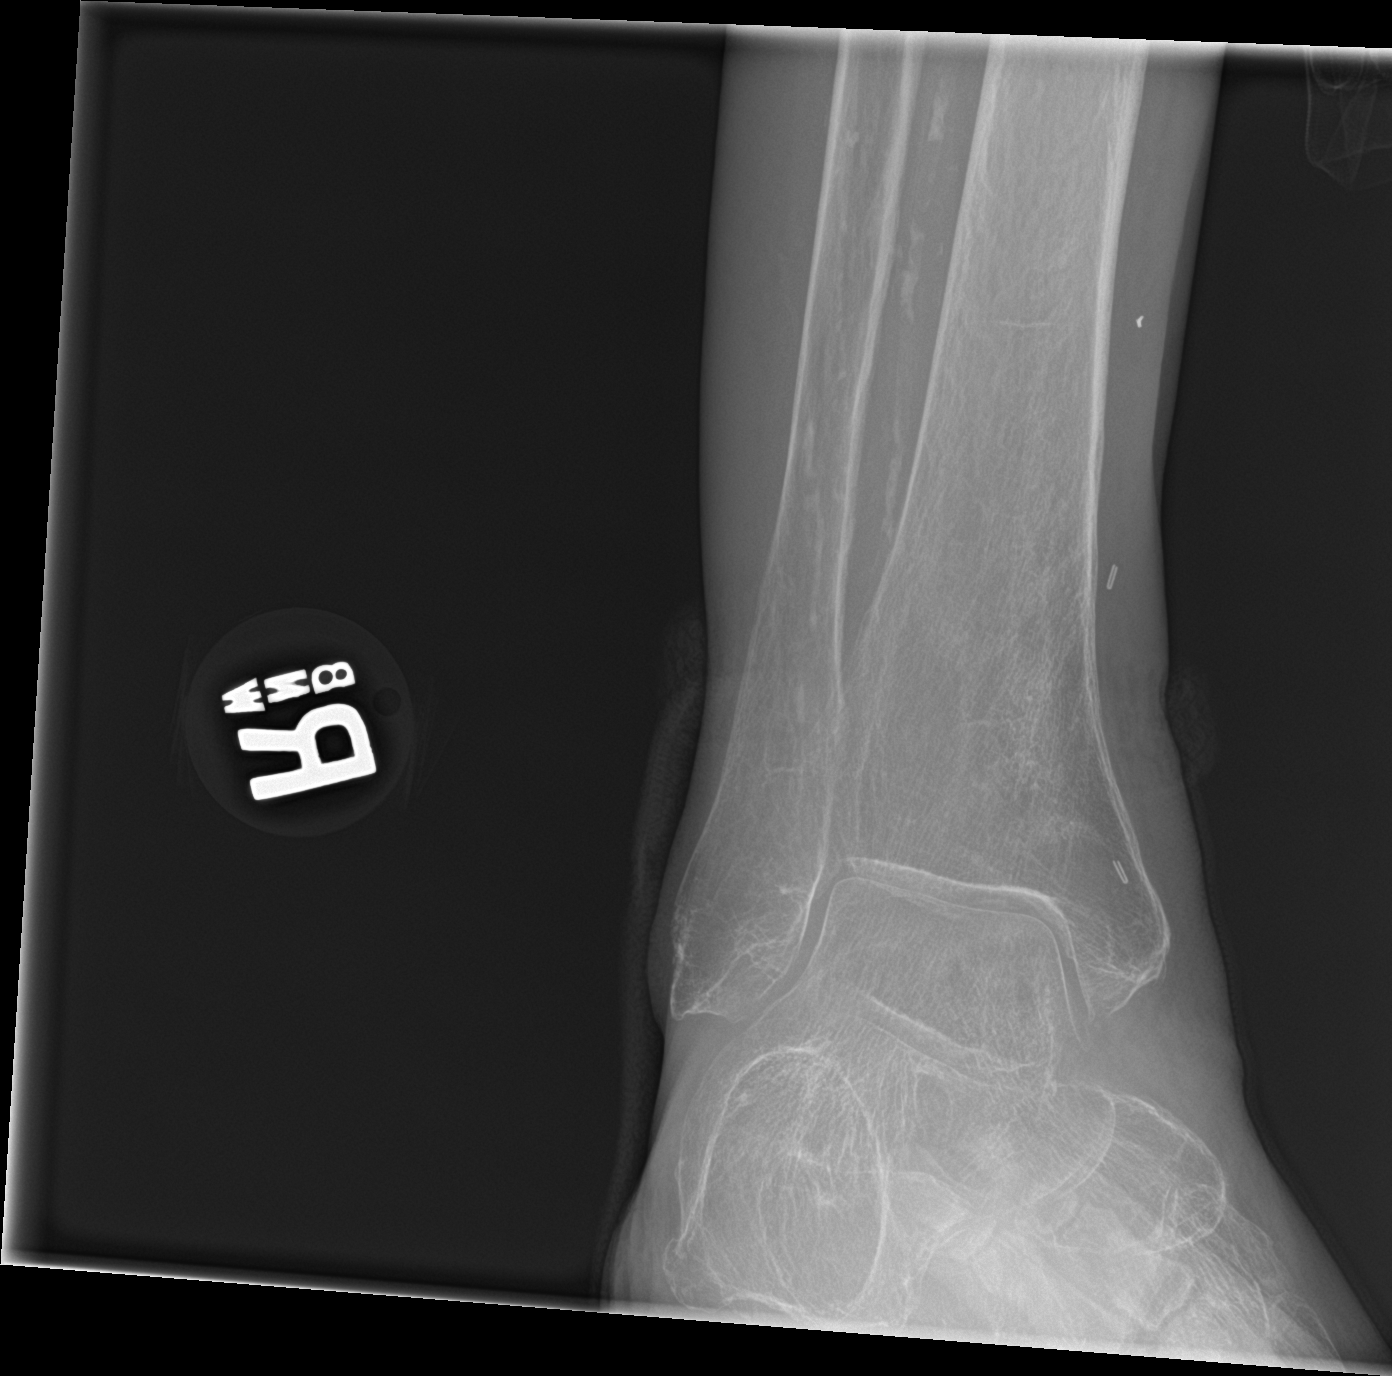

[ankle lat]
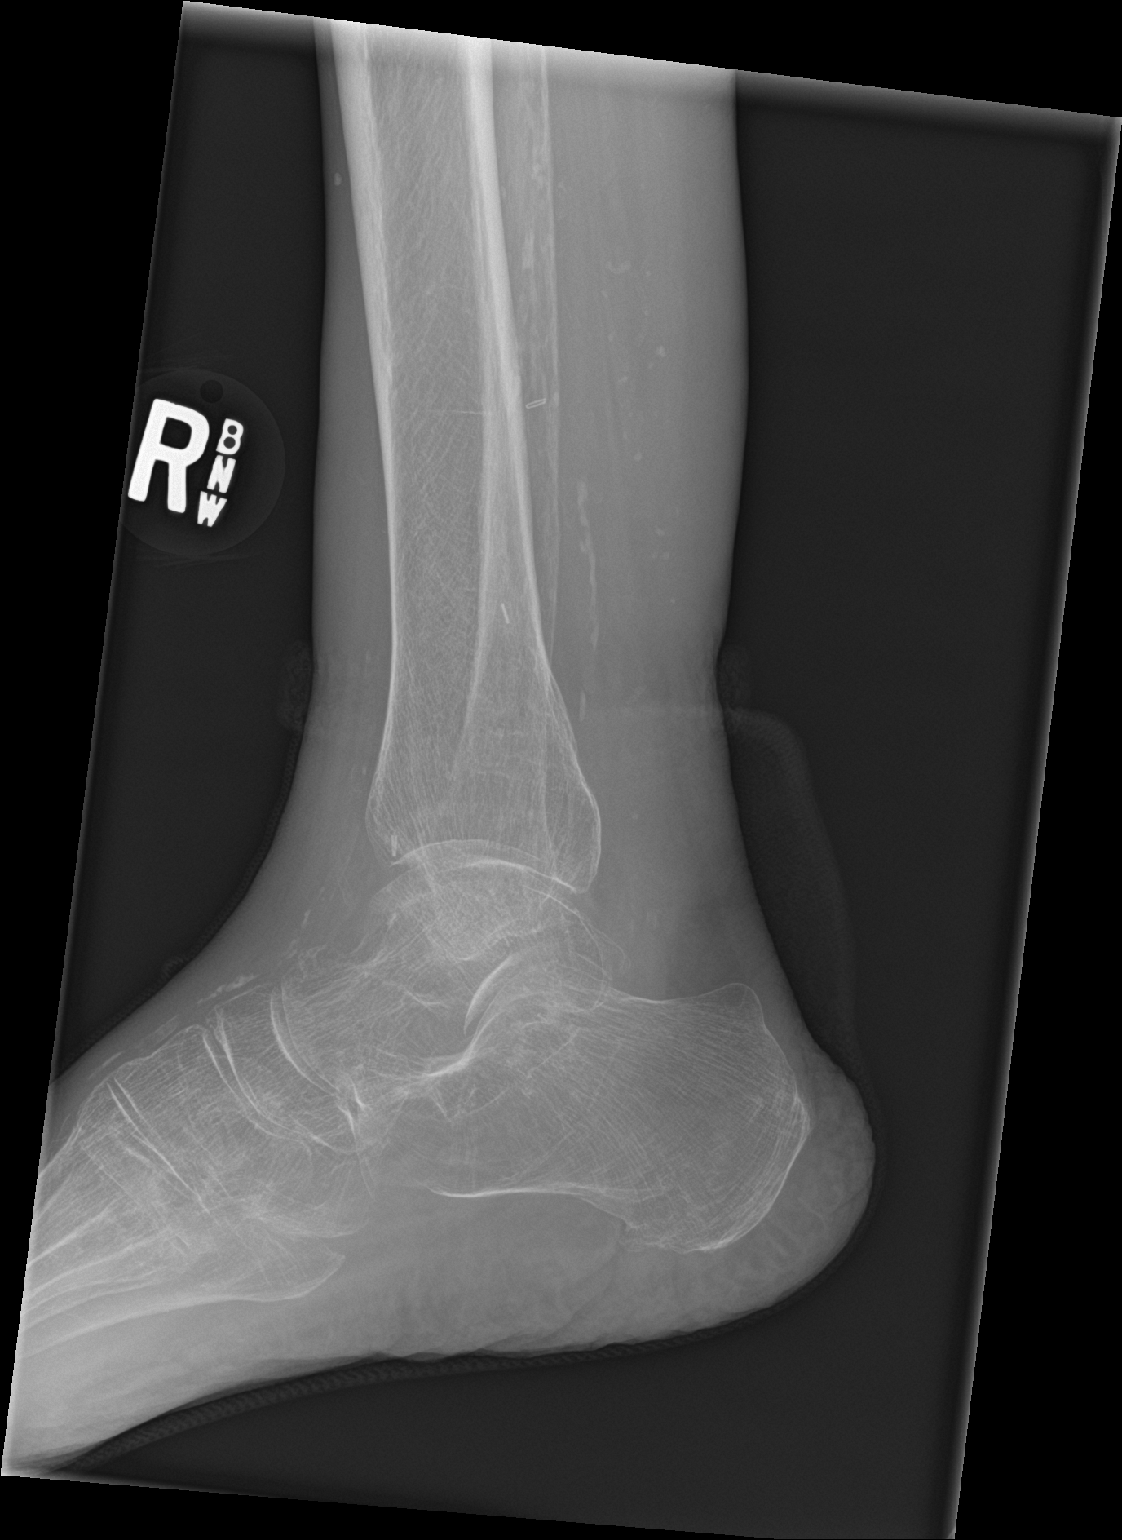

[3 of 3 positions shown; findings below may reference images not displayed]

FINDINGS: No fracture or dislocation is seen.

The ankle mortise is intact.

Mild degenerative changes.

Vascular calcifications.

The base of the fifth metatarsal is unremarkable.
IMPRESSION: No fracture or dislocation is seen.
